# Patient Record
Sex: Female | Born: 1985
Health system: Southern US, Community
[De-identification: ages and names within clinical notes are randomized; demographics above are authoritative.]

## PROBLEM LIST (undated history)

## (undated) DIAGNOSIS — T7840XA Allergy, unspecified, initial encounter: Secondary | ICD-10-CM

## (undated) HISTORY — DX: Allergy, unspecified, initial encounter: T78.40XA

---
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
## 2007-01-18 ENCOUNTER — Inpatient Hospital Stay (HOSPITAL_COMMUNITY): Admission: AD | Admit: 2007-01-18 | Discharge: 2007-01-18 | Payer: Self-pay | Admitting: Obstetrics & Gynecology

## 2007-01-18 ENCOUNTER — Ambulatory Visit: Payer: Self-pay | Admitting: Obstetrics and Gynecology

## 2007-01-21 ENCOUNTER — Ambulatory Visit (HOSPITAL_COMMUNITY): Admission: RE | Admit: 2007-01-21 | Discharge: 2007-01-21 | Payer: Self-pay | Admitting: Obstetrics and Gynecology

## 2007-04-30 ENCOUNTER — Inpatient Hospital Stay (HOSPITAL_COMMUNITY): Admission: AD | Admit: 2007-04-30 | Discharge: 2007-05-03 | Payer: Self-pay | Admitting: Obstetrics & Gynecology

## 2007-06-20 ENCOUNTER — Emergency Department (HOSPITAL_COMMUNITY): Admission: EM | Admit: 2007-06-20 | Discharge: 2007-06-20 | Payer: Self-pay | Admitting: Emergency Medicine

## 2008-02-27 ENCOUNTER — Emergency Department (HOSPITAL_COMMUNITY): Admission: EM | Admit: 2008-02-27 | Discharge: 2008-02-28 | Payer: Self-pay | Admitting: Emergency Medicine

## 2010-06-09 LAB — URINALYSIS, ROUTINE W REFLEX MICROSCOPIC
Bilirubin Urine: NEGATIVE
Glucose, UA: NEGATIVE mg/dL
Leukocytes, UA: NEGATIVE
Protein, ur: 30 mg/dL — AB
Specific Gravity, Urine: 1.014 (ref 1.005–1.030)
Urobilinogen, UA: 1 mg/dL (ref 0.0–1.0)
pH: 8 (ref 5.0–8.0)

## 2010-06-09 LAB — RAPID STREP SCREEN (MED CTR MEBANE ONLY): Streptococcus, Group A Screen (Direct): NEGATIVE

## 2010-07-08 NOTE — H&P (Signed)
[name], [name] NO.:  [id number]   MEDICAL RECORD NO.:  [id number]          PATIENT TYPE:  INP   LOCATION:  9109                          FACILITY:  [facility]   PHYSICIAN:  Roseanna Rainbow, M.D.DATE OF BIRTH:  10-Feb-1986   DATE OF ADMISSION:  04/30/2007  DATE OF DISCHARGE:                              HISTORY & PHYSICAL   CHIEF COMPLAINT:  The patient is a 25 year old gravida 1, para 0 who  presents at 46 plus weeks complaining of  rupture of membranes.   HISTORY OF PRESENT ILLNESS:  Please see the above.  She complains of  mild uterine contractions.   MEDICATIONS:  Please see the medications reconciliation form.   PRENATAL LABORATORY DATA:  Hepatitis B surface antigen negative. Rubella  immune.  HIV nonreactive.  RPR nonreactive.  Group B Strep is negative.  One hour GGT 100.  Blood type is O positive, antibody screen negative.   OB risk factors limited prenatal care.   PAST MEDICAL HISTORY:  She denies.   PAST SURGICAL HISTORY:  She denies.   PAST GYN HISTORY:  Vulvar condyloma.   PHYSICAL EXAMINATION:  VITAL SIGNS:  Stable.  Afebrile.  Fetal heart  tracing reassuring.  PELVIC:  Per the R.N., grossly ruptured.  Cervix is 3 cm dilated.  Clear  fluid was noted.   ASSESSMENT:  Primigravida at term, prodromal labor.  Fetal heart tracing  consistent with fetal well-being.   PLAN:  Admission.  Expectant management for now.  Possible augmentation  of labor with low dose Pitocin per protocol.      Roseanna Rainbow, M.D.  Electronically Signed     LAJ/MEDQ  D:  05/01/2007  T:  05/01/2007  Job:  [id number]

## 2010-11-17 LAB — CBC
HCT: 25.5 — ABNORMAL LOW
Hemoglobin: 9 — ABNORMAL LOW
MCHC: 34.7
MCHC: 35.3
MCV: 90.7
MCV: 91
Platelets: 184
RBC: 3.65 — ABNORMAL LOW
RDW: 13.7
RDW: 13.8

## 2010-11-17 LAB — URINALYSIS, ROUTINE W REFLEX MICROSCOPIC
Bilirubin Urine: NEGATIVE
Ketones, ur: NEGATIVE
Leukocytes, UA: NEGATIVE
Nitrite: NEGATIVE
Protein, ur: NEGATIVE
Specific Gravity, Urine: <1.005 — ABNORMAL LOW

## 2010-11-17 LAB — URINE MICROSCOPIC-ADD ON

## 2010-12-02 LAB — URINALYSIS, ROUTINE W REFLEX MICROSCOPIC
Bilirubin Urine: NEGATIVE
Nitrite: NEGATIVE
Specific Gravity, Urine: 1.015
pH: 7

## 2013-06-20 ENCOUNTER — Ambulatory Visit (INDEPENDENT_AMBULATORY_CARE_PROVIDER_SITE_OTHER): Payer: 59 | Admitting: Obstetrics

## 2013-06-20 ENCOUNTER — Encounter: Payer: Self-pay | Admitting: Obstetrics

## 2013-06-20 ENCOUNTER — Other Ambulatory Visit: Payer: Self-pay | Admitting: *Deleted

## 2013-06-20 VITALS — BP 93/63 | HR 71 | Temp 98.5°F | Ht 65.5 in | Wt 132.0 lb

## 2013-06-20 DIAGNOSIS — Z3046 Encounter for surveillance of implantable subdermal contraceptive: Secondary | ICD-10-CM

## 2013-06-20 DIAGNOSIS — J302 Other seasonal allergic rhinitis: Secondary | ICD-10-CM

## 2013-06-20 DIAGNOSIS — J309 Allergic rhinitis, unspecified: Secondary | ICD-10-CM

## 2013-06-20 DIAGNOSIS — Z309 Encounter for contraceptive management, unspecified: Secondary | ICD-10-CM

## 2013-06-20 MED ORDER — MEDROXYPROGESTERONE ACETATE 150 MG/ML IM SUSP
150.0000 mg | Freq: Once | INTRAMUSCULAR | Status: AC
  Administered 2013-06-20: 150 mg via INTRAMUSCULAR

## 2013-06-20 MED ORDER — MEDROXYPROGESTERONE ACETATE 150 MG/ML IM SUSP: 150.0000 mg | INTRAMUSCULAR | Status: DC

## 2013-06-20 MED ORDER — DESLORATADINE 5 MG PO TABS: 5.0000 mg | ORAL_TABLET | Freq: Every day | ORAL | Status: DC

## 2013-06-21 ENCOUNTER — Encounter: Payer: Self-pay | Admitting: Obstetrics

## 2013-06-22 ENCOUNTER — Encounter: Payer: Self-pay | Admitting: Obstetrics

## 2013-06-22 NOTE — Progress Notes (Signed)

## 2013-08-08 ENCOUNTER — Telehealth: Payer: Self-pay | Admitting: *Deleted

## 2013-08-08 NOTE — Telephone Encounter (Signed)
Patient left message requesting call back. LM on VM to CB.

## 2013-08-09 NOTE — Telephone Encounter (Signed)
Dr Clearance CootsHarper spoke to patient on phone today- 2 sample packs of OCP given. Lo LO Estrin

## 2013-08-11 ENCOUNTER — Telehealth: Payer: Self-pay | Admitting: *Deleted

## 2013-08-11 NOTE — Telephone Encounter (Signed)
Patient contact the office and requested a call back. Unaware of reason for call. Attempted to contact the patient and left message for patient to call back.

## 2013-09-07 ENCOUNTER — Encounter: Payer: Self-pay | Admitting: Obstetrics

## 2013-09-07 ENCOUNTER — Ambulatory Visit: Payer: 59

## 2013-09-07 ENCOUNTER — Ambulatory Visit (INDEPENDENT_AMBULATORY_CARE_PROVIDER_SITE_OTHER): Payer: 59 | Admitting: Obstetrics

## 2013-09-07 VITALS — BP 120/72 | HR 90 | Temp 97.5°F | Ht 65.5 in | Wt 130.0 lb

## 2013-09-07 DIAGNOSIS — Z01419 Encounter for gynecological examination (general) (routine) without abnormal findings: Secondary | ICD-10-CM

## 2013-09-07 DIAGNOSIS — Z0000 Encounter for general adult medical examination without abnormal findings: Secondary | ICD-10-CM

## 2013-09-08 ENCOUNTER — Ambulatory Visit: Payer: 59

## 2013-09-08 LAB — PAP IG W/ RFLX HPV ASCU

## 2013-09-08 LAB — WET PREP BY MOLECULAR PROBE
Candida species: NEGATIVE
GARDNERELLA VAGINALIS: POSITIVE — AB
TRICHOMONAS VAG: NEGATIVE

## 2013-09-08 NOTE — Progress Notes (Signed)
Subjective:     Adrienne [name] is a 28 y.o. female here for a routine exam.  Current complaints: None.    Personal health questionnaire:  Is patient Ashkenazi Jewish, have a family history of breast and/or ovarian cancer: no Is there a family history of uterine cancer diagnosed at age < 78, gastrointestinal cancer, urinary tract cancer, family member who is a Personnel officer syndrome-associated carrier: no Is the patient overweight and hypertensive, family history of diabetes, personal history of gestational diabetes or PCOS: no Is patient over 37, have PCOS,  family history of premature CHD under age 29, diabetes, smoke, have hypertension or peripheral artery disease:  no  The HPI was reviewed and explored in further detail by the provider. Gynecologic History No LMP recorded. Patient has had an injection. Contraception: Depo-Provera injections Last Pap: 2014. Results were: normal Last mammogram: n/a. Results were: n/a  Obstetric History OB History  Gravida Para Term Preterm AB SAB TAB Ectopic Multiple Living  1 1 1  0 0 0 0 0 0 1    # Outcome Date GA Lbr Len/2nd Weight Sex Delivery Anes PTL Lv  1 TRM 05/01/07 [redacted]w[redacted]d  8 lb 9 oz (3.884 kg) M SVD None  Y      History reviewed. No pertinent past medical history.  History reviewed. No pertinent past surgical history.  Current outpatient prescriptions:desloratadine (CLARINEX) 5 MG tablet, Take 1 tablet (5 mg total) by mouth daily., Disp: 30 tablet, Rfl: 11;  medroxyPROGESTERone (DEPO-PROVERA) 150 MG/ML injection, Inject 1 mL (150 mg total) into the muscle every 3 (three) months., Disp: 1 mL, Rfl: 3 No Known Allergies  History  Substance Use Topics  . Smoking status: Former Smoker    Quit date: 09/08/2007  . Smokeless tobacco: Never Used  . Alcohol Use: Yes     Comment: occasionally     Family History  Problem Relation Age of Onset  . Hypertension Mother       Review of Systems  Constitutional: negative for fatigue and weight  loss Respiratory: negative for cough and wheezing Cardiovascular: negative for chest pain, fatigue and palpitations Gastrointestinal: negative for abdominal pain and change in bowel habits Musculoskeletal:negative for myalgias Neurological: negative for gait problems and tremors Behavioral/Psych: negative for abusive relationship, depression Endocrine: negative for temperature intolerance   Genitourinary:negative for abnormal menstrual periods, genital lesions, hot flashes, sexual problems and vaginal discharge Integument/breast: negative for breast lump, breast tenderness, nipple discharge and skin lesion(s)    Objective:       BP 120/72  Pulse 90  Temp(Src) 97.5 F (36.4 C)  Ht 5' 5.5" (1.664 m)  Wt 130 lb (58.968 kg)  BMI 21.30 kg/m2 General:   alert  Skin:   no rash or abnormalities  Lungs:   clear to auscultation bilaterally  Heart:   regular rate and rhythm, S1, S2 normal, no murmur, click, rub or gallop  Breasts:   normal without suspicious masses, skin or nipple changes or axillary nodes  Abdomen:  normal findings: no organomegaly, soft, non-tender and no hernia  Pelvis:  External genitalia: normal general appearance Urinary system: urethral meatus normal and bladder without fullness, nontender Vaginal: normal without tenderness, induration or masses Cervix: normal appearance Adnexa: normal bimanual exam Uterus: anteverted and non-tender, normal size   Lab Review Urine pregnancy test Labs reviewed yes Radiologic studies reviewed no    Assessment:    Healthy female exam.   Contraceptive surveillance.   Plan:    Education reviewed: safe sex/STD prevention. Contraception:  Depo-Provera injections. Follow up in: 1 year.   No orders of the defined types were placed in this encounter.   Orders Placed This Encounter  Procedures  . WET PREP BY MOLECULAR PROBE  . GC/Chlamydia Probe Amp

## 2013-09-09 LAB — GC/CHLAMYDIA PROBE AMP
CT Probe RNA: NEGATIVE
GC PROBE AMP APTIMA: NEGATIVE

## 2013-09-11 ENCOUNTER — Ambulatory Visit (INDEPENDENT_AMBULATORY_CARE_PROVIDER_SITE_OTHER): Payer: 59 | Admitting: *Deleted

## 2013-09-11 ENCOUNTER — Ambulatory Visit: Payer: 59

## 2013-09-11 VITALS — BP 108/75 | HR 78 | Temp 98.0°F | Ht 65.5 in | Wt 130.0 lb

## 2013-09-11 DIAGNOSIS — Z3042 Encounter for surveillance of injectable contraceptive: Secondary | ICD-10-CM

## 2013-09-11 DIAGNOSIS — Z3049 Encounter for surveillance of other contraceptives: Secondary | ICD-10-CM

## 2013-09-11 MED ORDER — MEDROXYPROGESTERONE ACETATE 150 MG/ML IM SUSP
150.0000 mg | INTRAMUSCULAR | Status: AC
  Administered 2013-09-11 – 2014-05-23 (×4): 150 mg via INTRAMUSCULAR

## 2013-09-11 NOTE — Progress Notes (Signed)
Patient is in the office today for her DEPO Injection. Patient is on time for her Injection. Patient states there are no concerns today. Injection given in Left Deltoid. Patient tolerated well. Patient advised to make an appointment with the front for December 03, 2013 for her next DEPO Injection. Patient voiced understanding.   BP 108/75  Pulse 78  Temp(Src) 98 F (36.7 C)  Ht 5' 5.5" (1.664 m)  Wt 130 lb (58.968 kg)  BMI 21.30 kg/m2  Administrations This Visit   medroxyPROGESTERone (DEPO-PROVERA) injection 150 mg   Administered Action Dose Route Administered By   09/11/2013 Given 150 mg Intramuscular Odessa FlemingKristina M Mahala Rommel, LPN

## 2013-09-25 ENCOUNTER — Other Ambulatory Visit: Payer: Self-pay | Admitting: *Deleted

## 2013-09-25 DIAGNOSIS — N760 Acute vaginitis: Secondary | ICD-10-CM

## 2013-09-25 DIAGNOSIS — B9689 Other specified bacterial agents as the cause of diseases classified elsewhere: Secondary | ICD-10-CM

## 2013-09-25 MED ORDER — METRONIDAZOLE 500 MG PO TABS: 500.0000 mg | ORAL_TABLET | Freq: Two times a day (BID) | ORAL | Status: DC

## 2013-12-07 ENCOUNTER — Ambulatory Visit (INDEPENDENT_AMBULATORY_CARE_PROVIDER_SITE_OTHER): Payer: 59 | Admitting: *Deleted

## 2013-12-07 VITALS — BP 116/75 | HR 60 | Temp 98.3°F | Wt 128.0 lb

## 2013-12-07 DIAGNOSIS — N939 Abnormal uterine and vaginal bleeding, unspecified: Secondary | ICD-10-CM

## 2013-12-07 DIAGNOSIS — Z3042 Encounter for surveillance of injectable contraceptive: Secondary | ICD-10-CM

## 2013-12-07 MED ORDER — LEVONORGESTREL-ETHINYL ESTRAD 0.15-30 MG-MCG PO TABS: 1.0000 | ORAL_TABLET | Freq: Every day | ORAL | Status: DC

## 2013-12-07 NOTE — Progress Notes (Signed)
Pt is in office today for Depo injection.  Pt is on time for injection.  Pt states that she had had some form of bleeding since injection in July.  Pt states that she was given samples of birth control to take for breakthrough bleeding.  Pt states that pills did help some but she continued to bleed.  In review with Dr Clearance CootsHarper, it is advised to give pt injection today, continue to watch bleeding.  If pt is still having bleeding in 3-4 weeks pt is to take Nordette that was sent to her pharmacy.  Pt advised to contact office if still bleeding after taking birth control or as needed. Depo was given, pt tolerated well.  BP 116/75  Pulse 60  Temp(Src) 98.3 F (36.8 C)  Wt 128 lb (58.06 kg)  Administrations This Visit   medroxyPROGESTERone (DEPO-PROVERA) injection 150 mg   Administered Action Dose Route Administered By   12/07/2013 Given 150 mg Intramuscular Lanney GinsSuzanne D Dalena Plantz, CMA

## 2014-02-21 ENCOUNTER — Other Ambulatory Visit: Payer: Self-pay | Admitting: Family

## 2014-02-21 MED ORDER — PREDNISONE 10 MG PO TABS: ORAL_TABLET | ORAL | Status: DC

## 2014-02-28 ENCOUNTER — Ambulatory Visit (INDEPENDENT_AMBULATORY_CARE_PROVIDER_SITE_OTHER): Payer: 59 | Admitting: *Deleted

## 2014-02-28 VITALS — BP 113/76 | HR 77 | Ht 65.5 in | Wt 130.0 lb

## 2014-02-28 DIAGNOSIS — Z3042 Encounter for surveillance of injectable contraceptive: Secondary | ICD-10-CM

## 2014-02-28 NOTE — Progress Notes (Signed)
Patient in the office for a Depo Injection. Patient is on time for her injection. Patient tolerated injection well.  Patient due for next injection on May 23, 2014.  BP 113/76 mmHg  Pulse 77  Ht 5' 5.5" (1.664 m)  Wt 130 lb (58.968 kg)  BMI 21.30 kg/m2   Administrations This Visit    medroxyPROGESTERone (DEPO-PROVERA) injection 150 mg    Administered Action Dose Route Administered By         02/28/2014 Given 150 mg Intramuscular Shelda PalAndrea Lynn Melvinia Ashby, LPN

## 2014-05-10 ENCOUNTER — Other Ambulatory Visit: Payer: Self-pay | Admitting: Obstetrics

## 2014-05-17 ENCOUNTER — Other Ambulatory Visit: Payer: Self-pay | Admitting: Obstetrics

## 2014-05-17 DIAGNOSIS — T7840XA Allergy, unspecified, initial encounter: Secondary | ICD-10-CM

## 2014-05-17 MED ORDER — PREDNISONE 10 MG PO KIT: 10.0000 mg | PACK | ORAL | Status: DC

## 2014-05-22 ENCOUNTER — Other Ambulatory Visit: Payer: Self-pay | Admitting: Obstetrics

## 2014-05-23 ENCOUNTER — Ambulatory Visit (INDEPENDENT_AMBULATORY_CARE_PROVIDER_SITE_OTHER): Payer: 59 | Admitting: *Deleted

## 2014-05-23 VITALS — BP 126/77 | HR 96 | Wt 133.0 lb

## 2014-05-23 DIAGNOSIS — Z3042 Encounter for surveillance of injectable contraceptive: Secondary | ICD-10-CM

## 2014-05-23 DIAGNOSIS — T7840XA Allergy, unspecified, initial encounter: Secondary | ICD-10-CM

## 2014-05-23 DIAGNOSIS — J302 Other seasonal allergic rhinitis: Secondary | ICD-10-CM

## 2014-05-23 DIAGNOSIS — L989 Disorder of the skin and subcutaneous tissue, unspecified: Secondary | ICD-10-CM

## 2014-05-23 MED ORDER — DESLORATADINE 5 MG PO TABS: 5.0000 mg | ORAL_TABLET | Freq: Every day | ORAL | Status: DC

## 2014-05-23 MED ORDER — PREDNISONE (PAK) 10 MG PO TABS: ORAL_TABLET | ORAL | Status: DC

## 2014-05-23 MED ORDER — PREDNISONE 10 MG PO KIT: 10.0000 mg | PACK | ORAL | Status: DC

## 2014-05-23 NOTE — Progress Notes (Signed)
Pt is in office today for depo injection.  Pt is on time for injection.  Pt tolerated well. Pt advised that she is due for annual exam in July and will need this appt before next depo can be given.  Pt advised to return to office on August 08, 2014 for next injection.   Pt also asked to speak with Dr Clearance CootsHarper regarding Prednisone refill and need for referral.  Pt was escorted to Dr Clearance CootsHarper office to discuss her skin condition and need for continuation of prednisone until she can be seen by dermatology and/or allergist.  Dermatology referral was ordered and pt was scheduled for appt.  BP 126/77 mmHg  Pulse 96  Wt 133 lb (60.328 kg)  Administrations This Visit    medroxyPROGESTERone (DEPO-PROVERA) injection 150 mg    Admin Date Action Dose Route Administered By         05/23/2014 Given 150 mg Intramuscular Lanney GinsSuzanne D Foster, CMA

## 2014-08-08 ENCOUNTER — Ambulatory Visit: Payer: 59

## 2014-09-24 ENCOUNTER — Telehealth: Payer: Self-pay | Admitting: *Deleted

## 2014-09-24 DIAGNOSIS — Z30013 Encounter for initial prescription of injectable contraceptive: Secondary | ICD-10-CM

## 2014-09-24 MED ORDER — MEDROXYPROGESTERONE ACETATE 150 MG/ML IM SUSP: INTRAMUSCULAR | Status: DC

## 2014-09-24 NOTE — Telephone Encounter (Signed)
Patient is overdue her annual and her Depo. Patient has not been active in over 1 year and she has been having regular cycles. Per Dr Luella Cook to schedule patient at 4:15 and restart her Depo.

## 2014-10-09 ENCOUNTER — Ambulatory Visit: Payer: 59 | Admitting: Obstetrics

## 2014-10-18 ENCOUNTER — Ambulatory Visit: Payer: 59 | Admitting: Obstetrics

## 2014-10-25 ENCOUNTER — Ambulatory Visit: Payer: 59 | Admitting: Obstetrics

## 2016-02-26 DIAGNOSIS — J3089 Other allergic rhinitis: Secondary | ICD-10-CM | POA: Diagnosis not present

## 2016-02-26 DIAGNOSIS — J3081 Allergic rhinitis due to animal (cat) (dog) hair and dander: Secondary | ICD-10-CM | POA: Diagnosis not present

## 2016-02-26 DIAGNOSIS — J301 Allergic rhinitis due to pollen: Secondary | ICD-10-CM | POA: Diagnosis not present

## 2016-03-05 DIAGNOSIS — J301 Allergic rhinitis due to pollen: Secondary | ICD-10-CM | POA: Diagnosis not present

## 2016-03-05 DIAGNOSIS — J3089 Other allergic rhinitis: Secondary | ICD-10-CM | POA: Diagnosis not present

## 2016-03-05 DIAGNOSIS — J3081 Allergic rhinitis due to animal (cat) (dog) hair and dander: Secondary | ICD-10-CM | POA: Diagnosis not present

## 2016-03-18 DIAGNOSIS — J301 Allergic rhinitis due to pollen: Secondary | ICD-10-CM | POA: Diagnosis not present

## 2016-03-18 DIAGNOSIS — J3089 Other allergic rhinitis: Secondary | ICD-10-CM | POA: Diagnosis not present

## 2016-03-18 DIAGNOSIS — J3081 Allergic rhinitis due to animal (cat) (dog) hair and dander: Secondary | ICD-10-CM | POA: Diagnosis not present

## 2016-03-27 DIAGNOSIS — J3081 Allergic rhinitis due to animal (cat) (dog) hair and dander: Secondary | ICD-10-CM | POA: Diagnosis not present

## 2016-03-27 DIAGNOSIS — J3089 Other allergic rhinitis: Secondary | ICD-10-CM | POA: Diagnosis not present

## 2016-03-27 DIAGNOSIS — J301 Allergic rhinitis due to pollen: Secondary | ICD-10-CM | POA: Diagnosis not present

## 2016-04-02 DIAGNOSIS — J3081 Allergic rhinitis due to animal (cat) (dog) hair and dander: Secondary | ICD-10-CM | POA: Diagnosis not present

## 2016-04-02 DIAGNOSIS — J301 Allergic rhinitis due to pollen: Secondary | ICD-10-CM | POA: Diagnosis not present

## 2016-04-02 DIAGNOSIS — J3089 Other allergic rhinitis: Secondary | ICD-10-CM | POA: Diagnosis not present

## 2016-04-08 DIAGNOSIS — J3081 Allergic rhinitis due to animal (cat) (dog) hair and dander: Secondary | ICD-10-CM | POA: Diagnosis not present

## 2016-04-08 DIAGNOSIS — J3089 Other allergic rhinitis: Secondary | ICD-10-CM | POA: Diagnosis not present

## 2016-04-08 DIAGNOSIS — J301 Allergic rhinitis due to pollen: Secondary | ICD-10-CM | POA: Diagnosis not present

## 2016-04-15 DIAGNOSIS — J3081 Allergic rhinitis due to animal (cat) (dog) hair and dander: Secondary | ICD-10-CM | POA: Diagnosis not present

## 2016-04-15 DIAGNOSIS — J3089 Other allergic rhinitis: Secondary | ICD-10-CM | POA: Diagnosis not present

## 2016-04-15 DIAGNOSIS — J301 Allergic rhinitis due to pollen: Secondary | ICD-10-CM | POA: Diagnosis not present

## 2016-04-22 DIAGNOSIS — J3089 Other allergic rhinitis: Secondary | ICD-10-CM | POA: Diagnosis not present

## 2016-04-22 DIAGNOSIS — J301 Allergic rhinitis due to pollen: Secondary | ICD-10-CM | POA: Diagnosis not present

## 2016-04-22 DIAGNOSIS — J3081 Allergic rhinitis due to animal (cat) (dog) hair and dander: Secondary | ICD-10-CM | POA: Diagnosis not present

## 2016-04-29 DIAGNOSIS — J3089 Other allergic rhinitis: Secondary | ICD-10-CM | POA: Diagnosis not present

## 2016-04-29 DIAGNOSIS — J3081 Allergic rhinitis due to animal (cat) (dog) hair and dander: Secondary | ICD-10-CM | POA: Diagnosis not present

## 2016-04-29 DIAGNOSIS — J301 Allergic rhinitis due to pollen: Secondary | ICD-10-CM | POA: Diagnosis not present

## 2016-05-06 DIAGNOSIS — J3081 Allergic rhinitis due to animal (cat) (dog) hair and dander: Secondary | ICD-10-CM | POA: Diagnosis not present

## 2016-05-06 DIAGNOSIS — J301 Allergic rhinitis due to pollen: Secondary | ICD-10-CM | POA: Diagnosis not present

## 2016-05-06 DIAGNOSIS — J3089 Other allergic rhinitis: Secondary | ICD-10-CM | POA: Diagnosis not present

## 2016-05-13 DIAGNOSIS — J3081 Allergic rhinitis due to animal (cat) (dog) hair and dander: Secondary | ICD-10-CM | POA: Diagnosis not present

## 2016-05-13 DIAGNOSIS — J301 Allergic rhinitis due to pollen: Secondary | ICD-10-CM | POA: Diagnosis not present

## 2016-05-13 DIAGNOSIS — J3089 Other allergic rhinitis: Secondary | ICD-10-CM | POA: Diagnosis not present

## 2016-05-20 DIAGNOSIS — J3081 Allergic rhinitis due to animal (cat) (dog) hair and dander: Secondary | ICD-10-CM | POA: Diagnosis not present

## 2016-05-20 DIAGNOSIS — J301 Allergic rhinitis due to pollen: Secondary | ICD-10-CM | POA: Diagnosis not present

## 2016-05-20 DIAGNOSIS — J3089 Other allergic rhinitis: Secondary | ICD-10-CM | POA: Diagnosis not present

## 2016-05-27 DIAGNOSIS — J301 Allergic rhinitis due to pollen: Secondary | ICD-10-CM | POA: Diagnosis not present

## 2016-05-27 DIAGNOSIS — J3089 Other allergic rhinitis: Secondary | ICD-10-CM | POA: Diagnosis not present

## 2016-05-27 DIAGNOSIS — J3081 Allergic rhinitis due to animal (cat) (dog) hair and dander: Secondary | ICD-10-CM | POA: Diagnosis not present

## 2016-06-03 DIAGNOSIS — J3089 Other allergic rhinitis: Secondary | ICD-10-CM | POA: Diagnosis not present

## 2016-06-03 DIAGNOSIS — J301 Allergic rhinitis due to pollen: Secondary | ICD-10-CM | POA: Diagnosis not present

## 2016-06-03 DIAGNOSIS — J3081 Allergic rhinitis due to animal (cat) (dog) hair and dander: Secondary | ICD-10-CM | POA: Diagnosis not present

## 2016-06-10 DIAGNOSIS — J301 Allergic rhinitis due to pollen: Secondary | ICD-10-CM | POA: Diagnosis not present

## 2016-06-10 DIAGNOSIS — J3089 Other allergic rhinitis: Secondary | ICD-10-CM | POA: Diagnosis not present

## 2016-06-10 DIAGNOSIS — J3081 Allergic rhinitis due to animal (cat) (dog) hair and dander: Secondary | ICD-10-CM | POA: Diagnosis not present

## 2016-06-17 DIAGNOSIS — J301 Allergic rhinitis due to pollen: Secondary | ICD-10-CM | POA: Diagnosis not present

## 2016-06-17 DIAGNOSIS — J3089 Other allergic rhinitis: Secondary | ICD-10-CM | POA: Diagnosis not present

## 2016-06-17 DIAGNOSIS — J3081 Allergic rhinitis due to animal (cat) (dog) hair and dander: Secondary | ICD-10-CM | POA: Diagnosis not present

## 2016-06-24 DIAGNOSIS — J3089 Other allergic rhinitis: Secondary | ICD-10-CM | POA: Diagnosis not present

## 2016-06-24 DIAGNOSIS — J3081 Allergic rhinitis due to animal (cat) (dog) hair and dander: Secondary | ICD-10-CM | POA: Diagnosis not present

## 2016-06-24 DIAGNOSIS — J301 Allergic rhinitis due to pollen: Secondary | ICD-10-CM | POA: Diagnosis not present

## 2016-07-01 DIAGNOSIS — J3081 Allergic rhinitis due to animal (cat) (dog) hair and dander: Secondary | ICD-10-CM | POA: Diagnosis not present

## 2016-07-01 DIAGNOSIS — J301 Allergic rhinitis due to pollen: Secondary | ICD-10-CM | POA: Diagnosis not present

## 2016-07-01 DIAGNOSIS — J3089 Other allergic rhinitis: Secondary | ICD-10-CM | POA: Diagnosis not present

## 2016-07-08 DIAGNOSIS — J3089 Other allergic rhinitis: Secondary | ICD-10-CM | POA: Diagnosis not present

## 2016-07-08 DIAGNOSIS — J301 Allergic rhinitis due to pollen: Secondary | ICD-10-CM | POA: Diagnosis not present

## 2016-07-08 DIAGNOSIS — J3081 Allergic rhinitis due to animal (cat) (dog) hair and dander: Secondary | ICD-10-CM | POA: Diagnosis not present

## 2016-07-15 DIAGNOSIS — J301 Allergic rhinitis due to pollen: Secondary | ICD-10-CM | POA: Diagnosis not present

## 2016-07-15 DIAGNOSIS — J3089 Other allergic rhinitis: Secondary | ICD-10-CM | POA: Diagnosis not present

## 2016-07-15 DIAGNOSIS — J3081 Allergic rhinitis due to animal (cat) (dog) hair and dander: Secondary | ICD-10-CM | POA: Diagnosis not present

## 2016-07-27 ENCOUNTER — Ambulatory Visit (INDEPENDENT_AMBULATORY_CARE_PROVIDER_SITE_OTHER): Payer: 59 | Admitting: Family Medicine

## 2016-07-27 ENCOUNTER — Encounter: Payer: Self-pay | Admitting: Family Medicine

## 2016-07-27 VITALS — BP 100/70 | HR 88 | Temp 99.3°F | Resp 12 | Ht 65.5 in | Wt 120.0 lb

## 2016-07-27 DIAGNOSIS — J029 Acute pharyngitis, unspecified: Secondary | ICD-10-CM | POA: Diagnosis not present

## 2016-07-27 DIAGNOSIS — J039 Acute tonsillitis, unspecified: Secondary | ICD-10-CM | POA: Diagnosis not present

## 2016-07-27 DIAGNOSIS — I889 Nonspecific lymphadenitis, unspecified: Secondary | ICD-10-CM

## 2016-07-27 LAB — POCT RAPID STREP A (OFFICE): RAPID STREP A SCREEN: NEGATIVE

## 2016-07-27 MED ORDER — KETOROLAC TROMETHAMINE 60 MG/2ML IM SOLN
60.0000 mg | Freq: Once | INTRAMUSCULAR | Status: AC
  Administered 2016-07-27: 60 mg via INTRAMUSCULAR

## 2016-07-27 MED ORDER — TRAMADOL HCL 50 MG PO TABS: 50.0000 mg | ORAL_TABLET | Freq: Three times a day (TID) | ORAL | 0 refills | Status: AC | PRN

## 2016-07-27 MED ORDER — AMOXICILLIN-POT CLAVULANATE 875-125 MG PO TABS: 1.0000 | ORAL_TABLET | Freq: Two times a day (BID) | ORAL | 0 refills | Status: AC

## 2016-07-27 MED ORDER — MAGIC MOUTHWASH W/LIDOCAINE: 5.0000 mL | Freq: Three times a day (TID) | ORAL | 0 refills | Status: AC | PRN

## 2016-07-27 MED ORDER — IBUPROFEN 600 MG PO TABS: 600.0000 mg | ORAL_TABLET | Freq: Three times a day (TID) | ORAL | 0 refills | Status: AC | PRN

## 2016-07-27 NOTE — Progress Notes (Signed)
HPI:   [name] is a 31 y.o. female, who is here today to establish care.  Former PCP: N/A Last preventive routine visit: She follows with her gyn regularly, 11/2015.  Chronic medical problems: seasonal allergies, she follows with immunologists and on immunotherapy, weekly allergy shots.   Concerns today: Sore throat "very painful."  Severe sore throat that started yesterday afternoon suddenly. She has had some dry throat sensation fir the past 2-3 days, yesterday she woke up with sore throat. Pain is exacerbated by swallowing,talking,and mouth opening. No alleviating factors.  She has some post nasal drainage that she tries to bring up, this also exacerbate pain.  Noted right swollen gland,tender. + Right earache. + Chills. She has not noted fever, dysphagia, stridor, cough, wheezing, chest pain, dyspnea, abdominal pain, nausea, vomiting, or skin rash.  She denies any sick contact or recent travel. She has taken Ibuprofen, last dose yesterday.   Review of Systems  Constitutional: Positive for activity change, appetite change, chills and fatigue. Negative for fever.  HENT: Positive for ear pain, postnasal drip and sore throat. Negative for congestion, drooling, facial swelling, mouth sores, rhinorrhea, sinus pressure, trouble swallowing and voice change.   Eyes: Negative for discharge and redness.  Respiratory: Negative for cough, shortness of breath, wheezing and stridor.   Cardiovascular: Negative for palpitations and leg swelling.  Gastrointestinal: Negative for abdominal pain, diarrhea, nausea and vomiting.  Musculoskeletal: Positive for neck pain. Negative for myalgias and neck stiffness.  Skin: Negative for rash.  Allergic/Immunologic: Positive for environmental allergies.  Neurological: Negative for syncope, weakness and headaches.  Hematological: Positive for adenopathy. Does not bruise/bleed easily.  Psychiatric/Behavioral: Negative for confusion.  The patient is nervous/anxious.      Current Outpatient Prescriptions on File Prior to Visit  Medication Sig Dispense Refill  . desloratadine (CLARINEX) 5 MG tablet Take 1 tablet (5 mg total) by mouth daily. 30 tablet 11  . medroxyPROGESTERone (DEPO-PROVERA) 150 MG/ML injection INJECT 1 ML INTO THE MUSCLE EVERY 3 MONTHS 1 mL 3   No current facility-administered medications on file prior to visit.      Past Medical History:  Diagnosis Date  . Allergy    No Known Allergies  Family History  Problem Relation Age of Onset  . Hypertension Mother     Social History   Social History  . Marital status: Single    Spouse name: N/A  . Number of children: N/A  . Years of education: N/A   Social History Main Topics  . Smoking status: Former Smoker    Quit date: 09/08/2007  . Smokeless tobacco: Never Used  . Alcohol use 0.0 oz/week     Comment: occasionally   . Drug use: No  . Sexual activity: Not Currently    Partners: Male    Birth control/ protection: Injection   Other Topics Concern  . None   Social History Narrative  . None    Vitals:   07/27/16 1350  BP: 100/70  Pulse: 88  Resp: 12  Temp: 99.3 F (37.4 C)   O2 sat at RA 95% Body mass index is 19.67 kg/m.   Physical Exam  Nursing note and vitals reviewed. Constitutional: She is oriented to person, place, and time. She appears well-developed and well-nourished. She does not appear ill. No distress.  HENT:  Head: Atraumatic.  Right Ear: Tympanic membrane, external ear and ear canal normal.  Left Ear: Tympanic membrane, external ear and ear canal normal.  Nose:  Right sinus exhibits no maxillary sinus tenderness and no frontal sinus tenderness. Left sinus exhibits no maxillary sinus tenderness and no frontal sinus tenderness.  Mouth/Throat: Mucous membranes are normal. No oral lesions. There is trismus (mild) in the jaw. Uvula swelling present. Posterior oropharyngeal erythema present. No oropharyngeal exudate.  Tonsillar abscesses: ? right-sided.  Mild edema or uvula, mildly deviated to left. No petechial rash appreciated. Hypertrophic and erythematous tonsils, R>L. No drooling or dysphonia appreciated.   Eyes: Conjunctivae and EOM are normal.  Neck: No muscular tenderness present. No tracheal deviation, no edema and no erythema present.  Cardiovascular: Normal rate and regular rhythm.   No murmur heard. Respiratory: Effort normal and breath sounds normal. No stridor. No respiratory distress.  Musculoskeletal: She exhibits no edema.       Cervical back: She exhibits normal range of motion, no tenderness and no bony tenderness.  Lymphadenopathy:       Head (right side): Submandibular adenopathy present.       Head (left side): No submandibular adenopathy present.    She has cervical adenopathy.       Right cervical: Superficial cervical adenopathy present. No posterior cervical adenopathy present.      Left cervical: No posterior cervical adenopathy present.  Neurological: She is alert and oriented to person, place, and time. She has normal strength. No cranial nerve deficit. Coordination and gait normal.  Skin: Skin is warm. No rash noted. No erythema.  Psychiatric: Her mood appears anxious.  Well groomed, good eye contact.    ASSESSMENT AND PLAN:   Adrienne [name] was seen today for sore throat.  Diagnoses and all orders for this visit:  Sore throat -     POC Rapid Strep A -     Ambulatory referral to ENT  Acute tonsillitis, unspecified etiology  Question of peri tonsillar abscess right-sided. No stridor or signs of airway obstruction at the time of examination, so I do not think she needs ER evaluation at this time. Oral Abx started,some side effects discussed. Rapid strep negative, we will follow throat Cx. She has appt with Adrienne [name] tomorrow. She was clearly instructed about warning signs, she voices understanding.  -     amoxicillin-clavulanate (AUGMENTIN) 875-125 MG tablet; Take 1  tablet by mouth 2 (two) times daily. -     ibuprofen (ADVIL,MOTRIN) 600 MG tablet; Take 1 tablet (600 mg total) by mouth every 8 (eight) hours as needed. -     traMADol (ULTRAM) 50 MG tablet; Take 1 tablet (50 mg total) by mouth every 8 (eight) hours as needed. -     magic mouthwash w/lidocaine SOLN; Take 5 mLs by mouth 3 (three) times daily as needed for mouth pain.  Cervical lymphadenitis  Abx started. Skin is intact. Side effects of medications discussed, she can take Toradol at bedtime.   -     amoxicillin-clavulanate (AUGMENTIN) 875-125 MG tablet; Take 1 tablet by mouth 2 (two) times daily. -     ibuprofen (ADVIL,MOTRIN) 600 MG tablet; Take 1 tablet (600 mg total) by mouth every 8 (eight) hours as needed.      Khadeem Rockett G. [place], MD  [facility]. [place] office.

## 2016-07-27 NOTE — Patient Instructions (Signed)
A few things to remember from today's visit:   Sore throat - Plan: POC Rapid Strep A, Ambulatory referral to ENT  Acute tonsillitis, unspecified etiology - Plan: amoxicillin-clavulanate (AUGMENTIN) 875-125 MG tablet, ibuprofen (ADVIL,MOTRIN) 600 MG tablet, traMADol (ULTRAM) 50 MG tablet, magic mouthwash w/lidocaine SOLN  Cervical lymphadenitis - Plan: amoxicillin-clavulanate (AUGMENTIN) 875-125 MG tablet, ibuprofen (ADVIL,MOTRIN) 600 MG tablet  ENT appt tomorrow. Ibuprofen can be repeated in 6-8 hours.   Please be sure medication list is accurate. If a new problem present, please set up appointment sooner than planned today.

## 2016-07-28 DIAGNOSIS — J039 Acute tonsillitis, unspecified: Secondary | ICD-10-CM | POA: Diagnosis not present

## 2016-07-28 DIAGNOSIS — J36 Peritonsillar abscess: Secondary | ICD-10-CM | POA: Diagnosis not present

## 2016-07-29 DIAGNOSIS — J3089 Other allergic rhinitis: Secondary | ICD-10-CM | POA: Diagnosis not present

## 2016-07-29 DIAGNOSIS — J3081 Allergic rhinitis due to animal (cat) (dog) hair and dander: Secondary | ICD-10-CM | POA: Diagnosis not present

## 2016-07-29 DIAGNOSIS — J301 Allergic rhinitis due to pollen: Secondary | ICD-10-CM | POA: Diagnosis not present

## 2016-07-29 LAB — CULTURE, GROUP A STREP

## 2016-08-05 DIAGNOSIS — J3081 Allergic rhinitis due to animal (cat) (dog) hair and dander: Secondary | ICD-10-CM | POA: Diagnosis not present

## 2016-08-05 DIAGNOSIS — J3089 Other allergic rhinitis: Secondary | ICD-10-CM | POA: Diagnosis not present

## 2016-08-05 DIAGNOSIS — J301 Allergic rhinitis due to pollen: Secondary | ICD-10-CM | POA: Diagnosis not present

## 2016-08-06 DIAGNOSIS — J301 Allergic rhinitis due to pollen: Secondary | ICD-10-CM | POA: Diagnosis not present

## 2016-08-07 DIAGNOSIS — J3081 Allergic rhinitis due to animal (cat) (dog) hair and dander: Secondary | ICD-10-CM | POA: Diagnosis not present

## 2016-08-07 DIAGNOSIS — J3089 Other allergic rhinitis: Secondary | ICD-10-CM | POA: Diagnosis not present

## 2016-08-19 DIAGNOSIS — J3081 Allergic rhinitis due to animal (cat) (dog) hair and dander: Secondary | ICD-10-CM | POA: Diagnosis not present

## 2016-08-19 DIAGNOSIS — J3089 Other allergic rhinitis: Secondary | ICD-10-CM | POA: Diagnosis not present

## 2016-08-19 DIAGNOSIS — J301 Allergic rhinitis due to pollen: Secondary | ICD-10-CM | POA: Diagnosis not present

## 2016-09-08 DIAGNOSIS — J3081 Allergic rhinitis due to animal (cat) (dog) hair and dander: Secondary | ICD-10-CM | POA: Diagnosis not present

## 2016-09-08 DIAGNOSIS — J301 Allergic rhinitis due to pollen: Secondary | ICD-10-CM | POA: Diagnosis not present

## 2016-09-08 DIAGNOSIS — J3089 Other allergic rhinitis: Secondary | ICD-10-CM | POA: Diagnosis not present

## 2016-09-30 DIAGNOSIS — J3081 Allergic rhinitis due to animal (cat) (dog) hair and dander: Secondary | ICD-10-CM | POA: Diagnosis not present

## 2016-09-30 DIAGNOSIS — J301 Allergic rhinitis due to pollen: Secondary | ICD-10-CM | POA: Diagnosis not present

## 2016-09-30 DIAGNOSIS — J3089 Other allergic rhinitis: Secondary | ICD-10-CM | POA: Diagnosis not present

## 2016-10-21 DIAGNOSIS — J3089 Other allergic rhinitis: Secondary | ICD-10-CM | POA: Diagnosis not present

## 2016-10-21 DIAGNOSIS — J301 Allergic rhinitis due to pollen: Secondary | ICD-10-CM | POA: Diagnosis not present

## 2016-10-21 DIAGNOSIS — J3081 Allergic rhinitis due to animal (cat) (dog) hair and dander: Secondary | ICD-10-CM | POA: Diagnosis not present

## 2016-10-23 DIAGNOSIS — J301 Allergic rhinitis due to pollen: Secondary | ICD-10-CM | POA: Diagnosis not present

## 2016-10-23 DIAGNOSIS — J3081 Allergic rhinitis due to animal (cat) (dog) hair and dander: Secondary | ICD-10-CM | POA: Diagnosis not present

## 2016-10-23 DIAGNOSIS — J3089 Other allergic rhinitis: Secondary | ICD-10-CM | POA: Diagnosis not present

## 2016-10-30 DIAGNOSIS — J301 Allergic rhinitis due to pollen: Secondary | ICD-10-CM | POA: Diagnosis not present

## 2016-10-30 DIAGNOSIS — J3089 Other allergic rhinitis: Secondary | ICD-10-CM | POA: Diagnosis not present

## 2016-10-30 DIAGNOSIS — J3081 Allergic rhinitis due to animal (cat) (dog) hair and dander: Secondary | ICD-10-CM | POA: Diagnosis not present

## 2016-11-04 DIAGNOSIS — J3089 Other allergic rhinitis: Secondary | ICD-10-CM | POA: Diagnosis not present

## 2016-11-04 DIAGNOSIS — J3081 Allergic rhinitis due to animal (cat) (dog) hair and dander: Secondary | ICD-10-CM | POA: Diagnosis not present

## 2016-11-04 DIAGNOSIS — J301 Allergic rhinitis due to pollen: Secondary | ICD-10-CM | POA: Diagnosis not present

## 2016-11-11 DIAGNOSIS — J3081 Allergic rhinitis due to animal (cat) (dog) hair and dander: Secondary | ICD-10-CM | POA: Diagnosis not present

## 2016-11-11 DIAGNOSIS — J3089 Other allergic rhinitis: Secondary | ICD-10-CM | POA: Diagnosis not present

## 2016-11-11 DIAGNOSIS — J301 Allergic rhinitis due to pollen: Secondary | ICD-10-CM | POA: Diagnosis not present

## 2016-11-18 DIAGNOSIS — J301 Allergic rhinitis due to pollen: Secondary | ICD-10-CM | POA: Diagnosis not present

## 2016-11-18 DIAGNOSIS — J3081 Allergic rhinitis due to animal (cat) (dog) hair and dander: Secondary | ICD-10-CM | POA: Diagnosis not present

## 2016-11-18 DIAGNOSIS — J3089 Other allergic rhinitis: Secondary | ICD-10-CM | POA: Diagnosis not present

## 2016-11-25 DIAGNOSIS — J3089 Other allergic rhinitis: Secondary | ICD-10-CM | POA: Diagnosis not present

## 2016-11-25 DIAGNOSIS — J301 Allergic rhinitis due to pollen: Secondary | ICD-10-CM | POA: Diagnosis not present

## 2016-11-25 DIAGNOSIS — J3081 Allergic rhinitis due to animal (cat) (dog) hair and dander: Secondary | ICD-10-CM | POA: Diagnosis not present

## 2016-12-02 DIAGNOSIS — J301 Allergic rhinitis due to pollen: Secondary | ICD-10-CM | POA: Diagnosis not present

## 2016-12-02 DIAGNOSIS — J3081 Allergic rhinitis due to animal (cat) (dog) hair and dander: Secondary | ICD-10-CM | POA: Diagnosis not present

## 2016-12-02 DIAGNOSIS — J3089 Other allergic rhinitis: Secondary | ICD-10-CM | POA: Diagnosis not present

## 2016-12-22 DIAGNOSIS — J301 Allergic rhinitis due to pollen: Secondary | ICD-10-CM | POA: Diagnosis not present

## 2016-12-22 DIAGNOSIS — J3081 Allergic rhinitis due to animal (cat) (dog) hair and dander: Secondary | ICD-10-CM | POA: Diagnosis not present

## 2016-12-22 DIAGNOSIS — J3089 Other allergic rhinitis: Secondary | ICD-10-CM | POA: Diagnosis not present

## 2017-01-12 DIAGNOSIS — J3089 Other allergic rhinitis: Secondary | ICD-10-CM | POA: Diagnosis not present

## 2017-01-12 DIAGNOSIS — J3081 Allergic rhinitis due to animal (cat) (dog) hair and dander: Secondary | ICD-10-CM | POA: Diagnosis not present

## 2017-01-12 DIAGNOSIS — J301 Allergic rhinitis due to pollen: Secondary | ICD-10-CM | POA: Diagnosis not present

## 2017-02-03 DIAGNOSIS — J3081 Allergic rhinitis due to animal (cat) (dog) hair and dander: Secondary | ICD-10-CM | POA: Diagnosis not present

## 2017-02-03 DIAGNOSIS — J301 Allergic rhinitis due to pollen: Secondary | ICD-10-CM | POA: Diagnosis not present

## 2017-02-03 DIAGNOSIS — J3089 Other allergic rhinitis: Secondary | ICD-10-CM | POA: Diagnosis not present

## 2017-02-10 DIAGNOSIS — J3081 Allergic rhinitis due to animal (cat) (dog) hair and dander: Secondary | ICD-10-CM | POA: Diagnosis not present

## 2017-02-10 DIAGNOSIS — J3089 Other allergic rhinitis: Secondary | ICD-10-CM | POA: Diagnosis not present

## 2017-02-10 DIAGNOSIS — J301 Allergic rhinitis due to pollen: Secondary | ICD-10-CM | POA: Diagnosis not present

## 2017-02-24 DIAGNOSIS — J3081 Allergic rhinitis due to animal (cat) (dog) hair and dander: Secondary | ICD-10-CM | POA: Diagnosis not present

## 2017-02-24 DIAGNOSIS — J301 Allergic rhinitis due to pollen: Secondary | ICD-10-CM | POA: Diagnosis not present

## 2017-02-24 DIAGNOSIS — J3089 Other allergic rhinitis: Secondary | ICD-10-CM | POA: Diagnosis not present

## 2017-02-26 DIAGNOSIS — L509 Urticaria, unspecified: Secondary | ICD-10-CM | POA: Diagnosis not present

## 2017-02-26 DIAGNOSIS — J301 Allergic rhinitis due to pollen: Secondary | ICD-10-CM | POA: Diagnosis not present

## 2017-02-26 DIAGNOSIS — J3089 Other allergic rhinitis: Secondary | ICD-10-CM | POA: Diagnosis not present

## 2017-03-12 DIAGNOSIS — J3081 Allergic rhinitis due to animal (cat) (dog) hair and dander: Secondary | ICD-10-CM | POA: Diagnosis not present

## 2017-03-12 DIAGNOSIS — J3089 Other allergic rhinitis: Secondary | ICD-10-CM | POA: Diagnosis not present

## 2017-03-12 DIAGNOSIS — J301 Allergic rhinitis due to pollen: Secondary | ICD-10-CM | POA: Diagnosis not present

## 2017-03-31 DIAGNOSIS — J3081 Allergic rhinitis due to animal (cat) (dog) hair and dander: Secondary | ICD-10-CM | POA: Diagnosis not present

## 2017-03-31 DIAGNOSIS — J301 Allergic rhinitis due to pollen: Secondary | ICD-10-CM | POA: Diagnosis not present

## 2017-03-31 DIAGNOSIS — J3089 Other allergic rhinitis: Secondary | ICD-10-CM | POA: Diagnosis not present

## 2017-05-05 DIAGNOSIS — J3081 Allergic rhinitis due to animal (cat) (dog) hair and dander: Secondary | ICD-10-CM | POA: Diagnosis not present

## 2017-05-05 DIAGNOSIS — J3089 Other allergic rhinitis: Secondary | ICD-10-CM | POA: Diagnosis not present

## 2017-05-05 DIAGNOSIS — J301 Allergic rhinitis due to pollen: Secondary | ICD-10-CM | POA: Diagnosis not present

## 2017-06-04 DIAGNOSIS — J3081 Allergic rhinitis due to animal (cat) (dog) hair and dander: Secondary | ICD-10-CM | POA: Diagnosis not present

## 2017-06-04 DIAGNOSIS — J3089 Other allergic rhinitis: Secondary | ICD-10-CM | POA: Diagnosis not present

## 2017-06-04 DIAGNOSIS — J301 Allergic rhinitis due to pollen: Secondary | ICD-10-CM | POA: Diagnosis not present

## 2017-06-08 DIAGNOSIS — J301 Allergic rhinitis due to pollen: Secondary | ICD-10-CM | POA: Diagnosis not present

## 2017-06-08 DIAGNOSIS — J3081 Allergic rhinitis due to animal (cat) (dog) hair and dander: Secondary | ICD-10-CM | POA: Diagnosis not present

## 2017-06-08 DIAGNOSIS — J3089 Other allergic rhinitis: Secondary | ICD-10-CM | POA: Diagnosis not present

## 2017-06-16 DIAGNOSIS — J3081 Allergic rhinitis due to animal (cat) (dog) hair and dander: Secondary | ICD-10-CM | POA: Diagnosis not present

## 2017-06-16 DIAGNOSIS — J301 Allergic rhinitis due to pollen: Secondary | ICD-10-CM | POA: Diagnosis not present

## 2017-06-16 DIAGNOSIS — J3089 Other allergic rhinitis: Secondary | ICD-10-CM | POA: Diagnosis not present

## 2017-06-23 DIAGNOSIS — J3081 Allergic rhinitis due to animal (cat) (dog) hair and dander: Secondary | ICD-10-CM | POA: Diagnosis not present

## 2017-06-23 DIAGNOSIS — J3089 Other allergic rhinitis: Secondary | ICD-10-CM | POA: Diagnosis not present

## 2017-06-23 DIAGNOSIS — J301 Allergic rhinitis due to pollen: Secondary | ICD-10-CM | POA: Diagnosis not present

## 2017-06-29 DIAGNOSIS — J301 Allergic rhinitis due to pollen: Secondary | ICD-10-CM | POA: Diagnosis not present

## 2017-06-30 DIAGNOSIS — J3081 Allergic rhinitis due to animal (cat) (dog) hair and dander: Secondary | ICD-10-CM | POA: Diagnosis not present

## 2017-06-30 DIAGNOSIS — J301 Allergic rhinitis due to pollen: Secondary | ICD-10-CM | POA: Diagnosis not present

## 2017-06-30 DIAGNOSIS — J3089 Other allergic rhinitis: Secondary | ICD-10-CM | POA: Diagnosis not present

## 2017-07-09 ENCOUNTER — Encounter: Payer: Self-pay | Admitting: Obstetrics

## 2017-07-09 ENCOUNTER — Other Ambulatory Visit (HOSPITAL_COMMUNITY)
Admission: RE | Admit: 2017-07-09 | Discharge: 2017-07-09 | Disposition: A | Discharge status: Home / Self Care | Payer: 59 | Source: Ambulatory Visit | Attending: Obstetrics | Admitting: Obstetrics

## 2017-07-09 ENCOUNTER — Other Ambulatory Visit: Payer: Self-pay

## 2017-07-09 ENCOUNTER — Ambulatory Visit (INDEPENDENT_AMBULATORY_CARE_PROVIDER_SITE_OTHER): Payer: 59 | Admitting: Obstetrics

## 2017-07-09 VITALS — BP 116/76 | HR 70 | Ht 65.0 in | Wt 133.2 lb

## 2017-07-09 DIAGNOSIS — Z3009 Encounter for other general counseling and advice on contraception: Secondary | ICD-10-CM

## 2017-07-09 DIAGNOSIS — R87810 Cervical high risk human papillomavirus (HPV) DNA test positive: Secondary | ICD-10-CM | POA: Diagnosis not present

## 2017-07-09 DIAGNOSIS — Z0000 Encounter for general adult medical examination without abnormal findings: Secondary | ICD-10-CM

## 2017-07-09 DIAGNOSIS — Z3202 Encounter for pregnancy test, result negative: Secondary | ICD-10-CM | POA: Diagnosis not present

## 2017-07-09 DIAGNOSIS — J301 Allergic rhinitis due to pollen: Secondary | ICD-10-CM

## 2017-07-09 DIAGNOSIS — Z30011 Encounter for initial prescription of contraceptive pills: Secondary | ICD-10-CM | POA: Diagnosis not present

## 2017-07-09 DIAGNOSIS — Z01419 Encounter for gynecological examination (general) (routine) without abnormal findings: Secondary | ICD-10-CM | POA: Diagnosis not present

## 2017-07-09 DIAGNOSIS — Z01411 Encounter for gynecological examination (general) (routine) with abnormal findings: Secondary | ICD-10-CM | POA: Insufficient documentation

## 2017-07-09 DIAGNOSIS — R300 Dysuria: Secondary | ICD-10-CM | POA: Diagnosis not present

## 2017-07-09 LAB — POCT URINALYSIS DIPSTICK
BILIRUBIN UA: NEGATIVE
Glucose, UA: NEGATIVE
KETONES UA: NEGATIVE
Leukocytes, UA: NEGATIVE
Nitrite, UA: POSITIVE
Protein, UA: NEGATIVE
SPEC GRAV UA: 1.02 (ref 1.010–1.025)
UROBILINOGEN UA: 0.2 U/dL
pH, UA: 6.5 (ref 5.0–8.0)

## 2017-07-09 LAB — POCT URINE PREGNANCY: PREG TEST UR: NEGATIVE

## 2017-07-09 MED ORDER — PRENATAL PLUS 27-1 MG PO TABS: 1.0000 | ORAL_TABLET | Freq: Every day | ORAL | 11 refills | Status: DC

## 2017-07-09 MED ORDER — LEVONORGEST-ETH ESTRADIOL-IRON 0.1-20 MG-MCG(21) PO TABS: 1.0000 | ORAL_TABLET | Freq: Every day | ORAL | 11 refills | Status: DC

## 2017-07-09 MED ORDER — DESLORATADINE 5 MG PO TABS: 5.0000 mg | ORAL_TABLET | Freq: Every day | ORAL | 11 refills | Status: DC

## 2017-07-09 MED ORDER — NITROFURANTOIN MONOHYD MACRO 100 MG PO CAPS: 100.0000 mg | ORAL_CAPSULE | Freq: Two times a day (BID) | ORAL | 0 refills | Status: DC

## 2017-07-09 NOTE — Progress Notes (Signed)
Presents for AEX/PAP/Cultures.  C/o discomfort when voiding. Denies pain,NV, chills, fever.  Wants OCP.  UPT today is  NEGATIVE.

## 2017-07-09 NOTE — Progress Notes (Signed)
Subjective:        Adrienne [name] is a 32 y.o. female here for a routine exam.  Current complaints: Pain with urination.    Personal health questionnaire:  Is patient Ashkenazi Jewish, have a family history of breast and/or ovarian cancer: no Is there a family history of uterine cancer diagnosed at age < 34, gastrointestinal cancer, urinary tract cancer, family member who is a Personnel officer syndrome-associated carrier: no Is the patient overweight and hypertensive, family history of diabetes, personal history of gestational diabetes, preeclampsia or PCOS: no Is patient over 22, have PCOS,  family history of premature CHD under age 16, diabetes, smoke, have hypertension or peripheral artery disease:  no At any time, has a partner hit, kicked or otherwise hurt or frightened you?: no Over the past 2 weeks, have you felt down, depressed or hopeless?: no Over the past 2 weeks, have you felt little interest or pleasure in doing things?:no   Gynecologic History Patient's last menstrual period was 06/12/2017 (exact date). Contraception: Depo-Provera injections Last Pap: 2015. Results were: normal Last mammogram: n/a. Results were: n/a  Obstetric History OB History  Gravida Para Term Preterm AB Living  0 0 1  SAB TAB Ectopic Multiple Live Births  0 0 0 0 1    # Outcome Date GA Lbr Len/2nd Weight Sex Delivery Anes PTL Lv  1 Term 05/01/07 [redacted]w[redacted]d  8 lb 9 oz (3.884 kg) M Vag-Spont None  LIV    Past Medical History:  Diagnosis Date  . Allergy     History reviewed. No pertinent surgical history.   Current Outpatient Medications:  .  desloratadine (CLARINEX) 5 MG tablet, Take 1 tablet (5 mg total) by mouth daily. (Patient not taking: Reported on 07/09/2017), Disp: 30 tablet, Rfl: 11 .  medroxyPROGESTERone (DEPO-PROVERA) 150 MG/ML injection, INJECT 1 ML INTO THE MUSCLE EVERY 3 MONTHS (Patient not taking: Reported on 07/09/2017), Disp: 1 mL, Rfl: 3 No Known Allergies  Social History    Tobacco Use  . Smoking status: Former Smoker    Last attempt to quit: 09/08/2007    Years since quitting: 9.8  . Smokeless tobacco: Never Used  Substance Use Topics  . Alcohol use: Yes    Alcohol/week: 0.0 oz    Comment: occasionally     Family History  Problem Relation Age of Onset  . Hypertension Mother       Review of Systems  Constitutional: negative for fatigue and weight loss Respiratory: negative for cough and wheezing Cardiovascular: negative for chest pain, fatigue and palpitations Gastrointestinal: negative for abdominal pain and change in bowel habits Musculoskeletal:negative for myalgias Neurological: negative for gait problems and tremors Behavioral/Psych: negative for abusive relationship, depression Endocrine: negative for temperature intolerance    Genitourinary:negative for abnormal menstrual periods, genital lesions, hot flashes, sexual problems and vaginal discharge.  POSITIVE FOR PAIN WITH URINATION Integument/breast: negative for breast lump, breast tenderness, nipple discharge and skin lesion(s)    Objective:       BP 116/76   Pulse 70   Ht  (1.651 m)   Wt 133 lb 3.2 oz (60.4 kg)   LMP 06/12/2017 (Exact Date)   BMI 22.17 kg/m  General:   alert  Skin:   no rash or abnormalities  Lungs:   clear to auscultation bilaterally  Heart:   regular rate and rhythm, S1, S2 normal, no murmur, click, rub or gallop  Breasts:   normal without suspicious masses, skin or nipple changes  or axillary nodes  Abdomen:  normal findings: no organomegaly, soft, non-tender and no hernia  Pelvis:  External genitalia: normal general appearance Urinary system: urethral meatus normal and bladder without fullness, nontender Vaginal: normal without tenderness, induration or masses Cervix: normal appearance Adnexa: normal bimanual exam Uterus: anteverted and non-tender, normal size   Lab Review Urine pregnancy test Labs reviewed yes Radiologic studies reviewed  no  50% of 20 min visit spent on counseling and coordination of care.   Assessment:     1. Encounter for routine gynecological examination with Papanicolaou smear of cervix Rx: - Cytology - PAP - POCT urine pregnancy - Cervicovaginal ancillary only  2. Encounter for other general counseling or advice on contraception - wants OCP's  3. Encounter for initial prescription of contraceptive pills Rx: - Levonorgest-Eth Estrad-Fe Bisg (BALCOLTRA) 0.1-20 MG-MCG(21) TABS; Take 1 tablet by mouth daily before breakfast.  Dispense: 28 tablet; Refill: 11  4. Dysuria Rx: - nitrofurantoin, macrocrystal-monohydrate, (MACROBID) 100 MG capsule; Take 1 capsule (100 mg total) by mouth 2 (two) times daily.  Dispense: 14 capsule; Refill: 0  5. Routine adult health maintenance Rx: - prenatal vitamin w/FE, FA (PRENATAL 1 + 1) 27-1 MG TABS tablet; Take 1 tablet by mouth daily before breakfast.  Dispense: 30 each; Refill: 11    Plan:    Education reviewed: calcium supplements, depression evaluation, low fat, low cholesterol diet, safe sex/STD prevention, self breast exams and weight bearing exercise. Contraception: OCP (estrogen/progesterone). Follow up in: 1 year.   No orders of the defined types were placed in this encounter.  Orders Placed This Encounter  Procedures  . POCT urine pregnancy    Brock Bad MD 07-09-2017

## 2017-07-12 LAB — URINE CULTURE

## 2017-07-12 LAB — CERVICOVAGINAL ANCILLARY ONLY
Bacterial vaginitis: NEGATIVE
CANDIDA VAGINITIS: NEGATIVE
CHLAMYDIA, DNA PROBE: NEGATIVE
Neisseria Gonorrhea: NEGATIVE
Trichomonas: NEGATIVE

## 2017-07-14 DIAGNOSIS — J3081 Allergic rhinitis due to animal (cat) (dog) hair and dander: Secondary | ICD-10-CM | POA: Diagnosis not present

## 2017-07-14 DIAGNOSIS — J301 Allergic rhinitis due to pollen: Secondary | ICD-10-CM | POA: Diagnosis not present

## 2017-07-14 DIAGNOSIS — J3089 Other allergic rhinitis: Secondary | ICD-10-CM | POA: Diagnosis not present

## 2017-07-15 LAB — CYTOLOGY - PAP
DIAGNOSIS: NEGATIVE
HPV 16/18/45 genotyping: NEGATIVE
HPV: DETECTED — AB

## 2017-07-27 ENCOUNTER — Telehealth: Payer: Self-pay | Admitting: *Deleted

## 2017-07-27 ENCOUNTER — Other Ambulatory Visit: Payer: Self-pay | Admitting: Obstetrics

## 2017-07-27 DIAGNOSIS — Z30011 Encounter for initial prescription of contraceptive pills: Secondary | ICD-10-CM

## 2017-07-27 MED ORDER — NORGESTIMATE-ETH ESTRADIOL 0.25-35 MG-MCG PO TABS: 1.0000 | ORAL_TABLET | Freq: Every day | ORAL | 11 refills | Status: DC

## 2017-07-27 NOTE — Telephone Encounter (Signed)
Pt called to office for lab results. Pt made aware of results. Pt was tx for uti at time of visit.  Pt state she is still having urinary issues. Pt advised she may have appt to leave urine sample for culture in order to determine if uti is clear. Pt has been scheduled for Thursday afternoon.  Pt also ask about HPV on pap and any preventative measures. Pt made aware of Gardasil vaccine and advised she may want to consider.  Pt made aware will discuss with provider to verify pt approved for vaccine.   Pt also states that she does not [facility] given and [facility] to her pharmacy.  Pt would like to take Sprintec and would like [facility] Rx [facility] [address]  Pt made aware message to be sent to provider for Rx and vaccine recommendations.  Pt may expect return call later this week.   [facility] and Gardasil vaccine.

## 2017-07-27 NOTE — Telephone Encounter (Signed)
Sprintec 28 Rx and [facility] to [facility] [address]

## 2017-07-28 NOTE — Telephone Encounter (Signed)
Do you recommend pt start Gardasil series for +HPV on pap? Pt was concerned about this.

## 2017-07-29 ENCOUNTER — Ambulatory Visit: Payer: 59

## 2017-07-29 DIAGNOSIS — R8290 Unspecified abnormal findings in urine: Secondary | ICD-10-CM

## 2017-07-29 NOTE — Progress Notes (Signed)
Pt here complaining of cloudy urine. Pt just finished treatment for UTI 2 weeks ago. She states that she has been increasing her water intake, but no change in color.

## 2017-07-30 DIAGNOSIS — J301 Allergic rhinitis due to pollen: Secondary | ICD-10-CM | POA: Diagnosis not present

## 2017-07-30 DIAGNOSIS — J3081 Allergic rhinitis due to animal (cat) (dog) hair and dander: Secondary | ICD-10-CM | POA: Diagnosis not present

## 2017-07-30 DIAGNOSIS — J3089 Other allergic rhinitis: Secondary | ICD-10-CM | POA: Diagnosis not present

## 2017-08-02 ENCOUNTER — Other Ambulatory Visit: Payer: Self-pay | Admitting: Obstetrics

## 2017-08-02 LAB — URINE CULTURE

## 2017-08-20 DIAGNOSIS — J3089 Other allergic rhinitis: Secondary | ICD-10-CM | POA: Diagnosis not present

## 2017-08-20 DIAGNOSIS — J3081 Allergic rhinitis due to animal (cat) (dog) hair and dander: Secondary | ICD-10-CM | POA: Diagnosis not present

## 2017-09-23 ENCOUNTER — Other Ambulatory Visit: Payer: Self-pay | Admitting: Obstetrics

## 2017-09-23 ENCOUNTER — Ambulatory Visit (INDEPENDENT_AMBULATORY_CARE_PROVIDER_SITE_OTHER): Payer: 59

## 2017-09-23 DIAGNOSIS — N3000 Acute cystitis without hematuria: Secondary | ICD-10-CM

## 2017-09-23 DIAGNOSIS — R8290 Unspecified abnormal findings in urine: Secondary | ICD-10-CM | POA: Diagnosis not present

## 2017-09-23 DIAGNOSIS — Z0000 Encounter for general adult medical examination without abnormal findings: Secondary | ICD-10-CM

## 2017-09-23 LAB — POCT URINALYSIS DIPSTICK
BILIRUBIN UA: NEGATIVE
GLUCOSE UA: NEGATIVE
KETONES UA: NEGATIVE
Nitrite, UA: NEGATIVE
PH UA: 7 (ref 5.0–8.0)
Protein, UA: POSITIVE — AB
Spec Grav, UA: 1.015 (ref 1.010–1.025)
UROBILINOGEN UA: 0.2 U/dL

## 2017-09-23 MED ORDER — SULFAMETHOXAZOLE-TRIMETHOPRIM 800-160 MG PO TABS: 1.0000 | ORAL_TABLET | Freq: Two times a day (BID) | ORAL | 0 refills | Status: DC

## 2017-09-23 NOTE — Progress Notes (Signed)
Pt is here for nurse visit with c/o dysuria. Pt was last seen in 6/19 for same issue and given macrobid. Urine dip and culture done today. Will notify pt of results.

## 2017-09-26 ENCOUNTER — Other Ambulatory Visit: Payer: Self-pay | Admitting: Obstetrics

## 2017-09-26 DIAGNOSIS — N3090 Cystitis, unspecified without hematuria: Secondary | ICD-10-CM

## 2017-09-26 LAB — URINE CULTURE

## 2017-10-13 DIAGNOSIS — J3089 Other allergic rhinitis: Secondary | ICD-10-CM | POA: Diagnosis not present

## 2017-10-13 DIAGNOSIS — J3081 Allergic rhinitis due to animal (cat) (dog) hair and dander: Secondary | ICD-10-CM | POA: Diagnosis not present

## 2017-10-13 DIAGNOSIS — J301 Allergic rhinitis due to pollen: Secondary | ICD-10-CM | POA: Diagnosis not present

## 2017-10-15 DIAGNOSIS — J3089 Other allergic rhinitis: Secondary | ICD-10-CM | POA: Diagnosis not present

## 2017-10-15 DIAGNOSIS — J301 Allergic rhinitis due to pollen: Secondary | ICD-10-CM | POA: Diagnosis not present

## 2017-10-15 DIAGNOSIS — J3081 Allergic rhinitis due to animal (cat) (dog) hair and dander: Secondary | ICD-10-CM | POA: Diagnosis not present

## 2017-10-20 DIAGNOSIS — J3089 Other allergic rhinitis: Secondary | ICD-10-CM | POA: Diagnosis not present

## 2017-10-20 DIAGNOSIS — J301 Allergic rhinitis due to pollen: Secondary | ICD-10-CM | POA: Diagnosis not present

## 2017-10-20 DIAGNOSIS — J3081 Allergic rhinitis due to animal (cat) (dog) hair and dander: Secondary | ICD-10-CM | POA: Diagnosis not present

## 2017-10-22 DIAGNOSIS — J3081 Allergic rhinitis due to animal (cat) (dog) hair and dander: Secondary | ICD-10-CM | POA: Diagnosis not present

## 2017-10-22 DIAGNOSIS — J301 Allergic rhinitis due to pollen: Secondary | ICD-10-CM | POA: Diagnosis not present

## 2017-10-22 DIAGNOSIS — J3089 Other allergic rhinitis: Secondary | ICD-10-CM | POA: Diagnosis not present

## 2017-10-29 DIAGNOSIS — J3081 Allergic rhinitis due to animal (cat) (dog) hair and dander: Secondary | ICD-10-CM | POA: Diagnosis not present

## 2017-10-29 DIAGNOSIS — J301 Allergic rhinitis due to pollen: Secondary | ICD-10-CM | POA: Diagnosis not present

## 2017-10-29 DIAGNOSIS — J3089 Other allergic rhinitis: Secondary | ICD-10-CM | POA: Diagnosis not present

## 2017-11-03 DIAGNOSIS — J3081 Allergic rhinitis due to animal (cat) (dog) hair and dander: Secondary | ICD-10-CM | POA: Diagnosis not present

## 2017-11-03 DIAGNOSIS — J301 Allergic rhinitis due to pollen: Secondary | ICD-10-CM | POA: Diagnosis not present

## 2017-11-03 DIAGNOSIS — J3089 Other allergic rhinitis: Secondary | ICD-10-CM | POA: Diagnosis not present

## 2017-11-10 DIAGNOSIS — J301 Allergic rhinitis due to pollen: Secondary | ICD-10-CM | POA: Diagnosis not present

## 2017-11-10 DIAGNOSIS — J3081 Allergic rhinitis due to animal (cat) (dog) hair and dander: Secondary | ICD-10-CM | POA: Diagnosis not present

## 2017-11-10 DIAGNOSIS — J3089 Other allergic rhinitis: Secondary | ICD-10-CM | POA: Diagnosis not present

## 2017-11-17 DIAGNOSIS — J301 Allergic rhinitis due to pollen: Secondary | ICD-10-CM | POA: Diagnosis not present

## 2017-11-17 DIAGNOSIS — J3081 Allergic rhinitis due to animal (cat) (dog) hair and dander: Secondary | ICD-10-CM | POA: Diagnosis not present

## 2017-11-17 DIAGNOSIS — J3089 Other allergic rhinitis: Secondary | ICD-10-CM | POA: Diagnosis not present

## 2017-11-24 DIAGNOSIS — J3089 Other allergic rhinitis: Secondary | ICD-10-CM | POA: Diagnosis not present

## 2017-11-24 DIAGNOSIS — J3081 Allergic rhinitis due to animal (cat) (dog) hair and dander: Secondary | ICD-10-CM | POA: Diagnosis not present

## 2017-11-24 DIAGNOSIS — J301 Allergic rhinitis due to pollen: Secondary | ICD-10-CM | POA: Diagnosis not present

## 2017-12-01 DIAGNOSIS — J3081 Allergic rhinitis due to animal (cat) (dog) hair and dander: Secondary | ICD-10-CM | POA: Diagnosis not present

## 2017-12-01 DIAGNOSIS — J3089 Other allergic rhinitis: Secondary | ICD-10-CM | POA: Diagnosis not present

## 2017-12-01 DIAGNOSIS — J301 Allergic rhinitis due to pollen: Secondary | ICD-10-CM | POA: Diagnosis not present

## 2017-12-08 DIAGNOSIS — J3081 Allergic rhinitis due to animal (cat) (dog) hair and dander: Secondary | ICD-10-CM | POA: Diagnosis not present

## 2017-12-08 DIAGNOSIS — J301 Allergic rhinitis due to pollen: Secondary | ICD-10-CM | POA: Diagnosis not present

## 2017-12-08 DIAGNOSIS — J3089 Other allergic rhinitis: Secondary | ICD-10-CM | POA: Diagnosis not present

## 2017-12-17 DIAGNOSIS — J301 Allergic rhinitis due to pollen: Secondary | ICD-10-CM | POA: Diagnosis not present

## 2017-12-17 DIAGNOSIS — J3089 Other allergic rhinitis: Secondary | ICD-10-CM | POA: Diagnosis not present

## 2017-12-17 DIAGNOSIS — J3081 Allergic rhinitis due to animal (cat) (dog) hair and dander: Secondary | ICD-10-CM | POA: Diagnosis not present

## 2017-12-22 DIAGNOSIS — J3089 Other allergic rhinitis: Secondary | ICD-10-CM | POA: Diagnosis not present

## 2017-12-22 DIAGNOSIS — J3081 Allergic rhinitis due to animal (cat) (dog) hair and dander: Secondary | ICD-10-CM | POA: Diagnosis not present

## 2017-12-22 DIAGNOSIS — J301 Allergic rhinitis due to pollen: Secondary | ICD-10-CM | POA: Diagnosis not present

## 2017-12-29 DIAGNOSIS — J3081 Allergic rhinitis due to animal (cat) (dog) hair and dander: Secondary | ICD-10-CM | POA: Diagnosis not present

## 2017-12-29 DIAGNOSIS — J3089 Other allergic rhinitis: Secondary | ICD-10-CM | POA: Diagnosis not present

## 2017-12-29 DIAGNOSIS — J301 Allergic rhinitis due to pollen: Secondary | ICD-10-CM | POA: Diagnosis not present

## 2018-01-12 DIAGNOSIS — J3089 Other allergic rhinitis: Secondary | ICD-10-CM | POA: Diagnosis not present

## 2018-01-12 DIAGNOSIS — J3081 Allergic rhinitis due to animal (cat) (dog) hair and dander: Secondary | ICD-10-CM | POA: Diagnosis not present

## 2018-01-12 DIAGNOSIS — J301 Allergic rhinitis due to pollen: Secondary | ICD-10-CM | POA: Diagnosis not present

## 2018-01-27 DIAGNOSIS — J3089 Other allergic rhinitis: Secondary | ICD-10-CM | POA: Diagnosis not present

## 2018-01-27 DIAGNOSIS — J301 Allergic rhinitis due to pollen: Secondary | ICD-10-CM | POA: Diagnosis not present

## 2018-01-27 DIAGNOSIS — J3081 Allergic rhinitis due to animal (cat) (dog) hair and dander: Secondary | ICD-10-CM | POA: Diagnosis not present

## 2018-02-02 DIAGNOSIS — J3081 Allergic rhinitis due to animal (cat) (dog) hair and dander: Secondary | ICD-10-CM | POA: Diagnosis not present

## 2018-02-02 DIAGNOSIS — J3089 Other allergic rhinitis: Secondary | ICD-10-CM | POA: Diagnosis not present

## 2018-02-02 DIAGNOSIS — J301 Allergic rhinitis due to pollen: Secondary | ICD-10-CM | POA: Diagnosis not present

## 2018-02-09 DIAGNOSIS — J301 Allergic rhinitis due to pollen: Secondary | ICD-10-CM | POA: Diagnosis not present

## 2018-02-09 DIAGNOSIS — J3081 Allergic rhinitis due to animal (cat) (dog) hair and dander: Secondary | ICD-10-CM | POA: Diagnosis not present

## 2018-02-09 DIAGNOSIS — J3089 Other allergic rhinitis: Secondary | ICD-10-CM | POA: Diagnosis not present

## 2018-05-14 ENCOUNTER — Other Ambulatory Visit: Payer: Self-pay | Admitting: Obstetrics

## 2018-05-14 DIAGNOSIS — Z30011 Encounter for initial prescription of contraceptive pills: Secondary | ICD-10-CM

## 2018-07-29 ENCOUNTER — Ambulatory Visit (INDEPENDENT_AMBULATORY_CARE_PROVIDER_SITE_OTHER): Payer: Self-pay

## 2018-07-29 ENCOUNTER — Other Ambulatory Visit: Payer: Self-pay

## 2018-07-29 VITALS — BP 121/78 | HR 88 | Wt 142.9 lb

## 2018-07-29 DIAGNOSIS — N3000 Acute cystitis without hematuria: Secondary | ICD-10-CM

## 2018-07-29 DIAGNOSIS — N3001 Acute cystitis with hematuria: Secondary | ICD-10-CM

## 2018-07-29 MED ORDER — SULFAMETHOXAZOLE-TRIMETHOPRIM 800-160 MG PO TABS: 1.0000 | ORAL_TABLET | Freq: Two times a day (BID) | ORAL | 0 refills | Status: DC

## 2018-07-29 NOTE — Progress Notes (Addendum)
SUBJECTIVE: Adrienne [name] is a 33 y.o. female who complains of urinary frequency, urgency and dysuria x 7 days, without flank pain, fever, chills, or abnormal vaginal discharge or bleeding.   OBJECTIVE: Appears well, in no apparent distress.  Vital signs are normal. Urine dipstick shows: Unable to read as patient is taking AZO and turned Dip Stick ORANGE.    ASSESSMENT: Dysuria  PLAN: Urine sent for Culture. Rx sent to Pharmacy. Treatment per orders.  Call or return to clinic prn if these symptoms worsen or fail to improve as anticipated.   I have reviewed the chart and agree with nursing staff's documentation of this patient's encounter.  Coral Ceo, MD 07/29/2018 9:56 AM

## 2018-08-01 ENCOUNTER — Other Ambulatory Visit: Payer: Self-pay | Admitting: Obstetrics

## 2018-08-01 DIAGNOSIS — N3001 Acute cystitis with hematuria: Secondary | ICD-10-CM

## 2018-08-01 LAB — URINE CULTURE

## 2018-08-01 MED ORDER — AMOXICILLIN-POT CLAVULANATE 875-125 MG PO TABS: 1.0000 | ORAL_TABLET | Freq: Two times a day (BID) | ORAL | 0 refills | Status: DC

## 2018-08-03 ENCOUNTER — Telehealth: Payer: Self-pay

## 2018-08-03 NOTE — Telephone Encounter (Signed)
Returned call and pt wanted to know if UPT was done at last visit, advised that it was not.

## 2018-08-18 ENCOUNTER — Telehealth: Payer: Self-pay

## 2018-08-18 NOTE — Telephone Encounter (Signed)
Returned pt's call. She did not leave a detailed VM.  LVM for pt to c/b if she still needs assistance.

## 2018-08-23 ENCOUNTER — Other Ambulatory Visit: Payer: Self-pay | Admitting: Obstetrics

## 2018-08-25 ENCOUNTER — Telehealth: Payer: Self-pay

## 2018-08-25 NOTE — Telephone Encounter (Signed)
Pt called requesting if we could refill Latisse drops for her she states where she was originally getting them filled they have recently relocated out of the area. Dr.Harper is currently out of the office. Her next Annual will be in August here in the office.

## 2018-08-25 NOTE — Telephone Encounter (Signed)
Provider was Loman [place] she has joined a office in [place], [place]   She stated she will not be able to get a appointment with them She stated since she would be coming to [place] for a annual soon could we feel these for her.

## 2018-08-29 NOTE — Telephone Encounter (Signed)
Understood.  Thanks.

## 2018-09-28 ENCOUNTER — Encounter

## 2018-09-30 ENCOUNTER — Ambulatory Visit: Payer: Medicaid Other | Admitting: Obstetrics

## 2018-09-30 ENCOUNTER — Other Ambulatory Visit: Payer: Self-pay

## 2018-09-30 ENCOUNTER — Encounter: Payer: Self-pay | Admitting: Obstetrics

## 2018-09-30 VITALS — BP 126/76 | HR 60 | Temp 97.6°F | Wt 141.9 lb

## 2018-09-30 NOTE — Progress Notes (Signed)
Patient is in the office for annual, states that here cycle is currently on and its heavy, provider advised pt to reschedule appt.

## 2018-10-01 NOTE — Progress Notes (Signed)
Appointment cancelled

## 2018-10-07 ENCOUNTER — Other Ambulatory Visit: Payer: Self-pay

## 2018-10-07 ENCOUNTER — Other Ambulatory Visit (HOSPITAL_COMMUNITY)
Admission: RE | Admit: 2018-10-07 | Discharge: 2018-10-07 | Disposition: A | Discharge status: Home / Self Care | Payer: Managed Care, Other (non HMO) | Source: Ambulatory Visit | Attending: Obstetrics | Admitting: Obstetrics

## 2018-10-07 ENCOUNTER — Encounter: Payer: Self-pay | Admitting: Obstetrics

## 2018-10-07 ENCOUNTER — Ambulatory Visit (INDEPENDENT_AMBULATORY_CARE_PROVIDER_SITE_OTHER): Payer: Managed Care, Other (non HMO) | Admitting: Obstetrics

## 2018-10-07 VITALS — BP 117/81 | HR 76 | Temp 98.2°F | Wt 139.0 lb

## 2018-10-07 DIAGNOSIS — Z01419 Encounter for gynecological examination (general) (routine) without abnormal findings: Secondary | ICD-10-CM | POA: Diagnosis not present

## 2018-10-07 DIAGNOSIS — H02729 Madarosis of unspecified eye, unspecified eyelid and periocular area: Secondary | ICD-10-CM

## 2018-10-07 DIAGNOSIS — Z0000 Encounter for general adult medical examination without abnormal findings: Secondary | ICD-10-CM

## 2018-10-07 DIAGNOSIS — N898 Other specified noninflammatory disorders of vagina: Secondary | ICD-10-CM

## 2018-10-07 DIAGNOSIS — Z30011 Encounter for initial prescription of contraceptive pills: Secondary | ICD-10-CM

## 2018-10-07 DIAGNOSIS — J301 Allergic rhinitis due to pollen: Secondary | ICD-10-CM

## 2018-10-07 DIAGNOSIS — Z113 Encounter for screening for infections with a predominantly sexual mode of transmission: Secondary | ICD-10-CM

## 2018-10-07 MED ORDER — BIMATOPROST 0.03 % OP SOLN: 1.0000 [drp] | Freq: Every day | OPHTHALMIC | 4 refills | Status: DC

## 2018-10-07 MED ORDER — NORGESTIMATE-ETH ESTRADIOL 0.25-35 MG-MCG PO TABS: 1.0000 | ORAL_TABLET | Freq: Every day | ORAL | 3 refills | Status: DC

## 2018-10-07 MED ORDER — PRENATAL PLUS 27-1 MG PO TABS: 1.0000 | ORAL_TABLET | Freq: Every day | ORAL | 4 refills | Status: DC

## 2018-10-07 MED ORDER — DESLORATADINE 5 MG PO TABS: 5.0000 mg | ORAL_TABLET | Freq: Every day | ORAL | 11 refills | Status: DC

## 2018-10-07 NOTE — Progress Notes (Signed)
Subjective:        Adrienne [name] is a 33 y.o. female here for a routine exam.  Current complaints: None.    Personal health questionnaire:  Is patient Ashkenazi Jewish, have a family history of breast and/or ovarian cancer: no Is there a family history of uterine cancer diagnosed at age < 6550, gastrointestinal cancer, urinary tract cancer, family member who is a Personnel officerLynch syndrome-associated carrier: no Is the patient overweight and hypertensive, family history of diabetes, personal history of gestational diabetes, preeclampsia or PCOS: no Is patient over 2755, have PCOS,  family history of premature CHD under age 31, diabetes, smoke, have hypertension or peripheral artery disease:  no At any time, has a partner hit, kicked or otherwise hurt or frightened you?: no Over the past 2 weeks, have you felt down, depressed or hopeless?: no Over the past 2 weeks, have you felt little interest or pleasure in doing things?:no   Gynecologic History Patient's last menstrual period was 10/02/2018. Contraception: OCP (estrogen/progesterone) Last Pap: 07-09-2017. Results were: normal Last mammogram: n/a. Results were: n/a  Obstetric History OB History  Gravida Para Term Preterm AB Living  1 1 1  0 0 1  SAB TAB Ectopic Multiple Live Births  0 0 0 0 1    # Outcome Date GA Lbr Len/2nd Weight Sex Delivery Anes PTL Lv  1 Term 05/01/07 [id number]  8 lb 9 oz (3.884 kg) M Vag-Spont None  LIV    Past Medical History:  Diagnosis Date  . Allergy     History reviewed. No pertinent surgical history.   Current Outpatient Medications:  .  desloratadine (CLARINEX) 5 MG tablet, Take 1 tablet (5 mg total) by mouth daily., Disp: 30 tablet, Rfl: 11 .  norgestimate-ethinyl estradiol (SPRINTEC 28) 0.25-35 MG-MCG tablet, Take 1 tablet by mouth daily., Disp: 1 Package, Rfl: 3 .  amoxicillin-clavulanate (AUGMENTIN) 875-125 MG tablet, Take 1 tablet by mouth 2 (two) times daily. (Patient not taking: Reported on 09/30/2018),  Disp: 14 tablet, Rfl: 0 .  bimatoprost (LUMIGAN) 0.03 % ophthalmic solution, Place 1 drop into both eyes at bedtime., Disp: 5 mL, Rfl: 4 .  medroxyPROGESTERone (DEPO-PROVERA) 150 MG/ML injection, INJECT 1 ML INTO THE MUSCLE EVERY 3 MONTHS (Patient not taking: Reported on 07/09/2017), Disp: 1 mL, Rfl: 3 .  nitrofurantoin, macrocrystal-monohydrate, (MACROBID) 100 MG capsule, Take 1 capsule (100 mg total) by mouth 2 (two) times daily. (Patient not taking: Reported on 09/30/2018), Disp: 14 capsule, Rfl: 0 .  prenatal vitamin w/FE, FA (PRENATAL 1 + 1) 27-1 MG TABS tablet, Take 1 tablet by mouth daily before breakfast., Disp: 90 tablet, Rfl: 4 .  sulfamethoxazole-trimethoprim (BACTRIM DS) 800-160 MG tablet, Take 1 tablet by mouth 2 (two) times daily. (Patient not taking: Reported on 09/30/2018), Disp: 14 tablet, Rfl: 0 Not on File  Social History   Tobacco Use  . Smoking status: Former Smoker    Quit date: 09/08/2007    Years since quitting: 11.0  . Smokeless tobacco: Never Used  Substance Use Topics  . Alcohol use: Yes    Alcohol/week: 0.0 standard drinks    Comment: occasionally     Family History  Problem Relation Age of Onset  . Hypertension Mother       Review of Systems  Constitutional: negative for fatigue and weight loss Respiratory: negative for cough and wheezing Cardiovascular: negative for chest pain, fatigue and palpitations Gastrointestinal: negative for abdominal pain and change in bowel habits Musculoskeletal:negative for myalgias Neurological: negative for  gait problems and tremors Behavioral/Psych: negative for abusive relationship, depression Endocrine: negative for temperature intolerance    Genitourinary:negative for abnormal menstrual periods, genital lesions, hot flashes, sexual problems and vaginal discharge Integument/breast: negative for breast lump, breast tenderness, nipple discharge and skin lesion(s)    Objective:       BP 117/81   Pulse 76   Temp 98.2 F  (36.8 C)   Wt 139 lb (63 kg)   LMP 10/02/2018   BMI 23.13 kg/m  General:   alert  Skin:   no rash or abnormalities  Lungs:   clear to auscultation bilaterally  Heart:   regular rate and rhythm, S1, S2 normal, no murmur, click, rub or gallop  Breasts:   normal without suspicious masses, skin or nipple changes or axillary nodes  Abdomen:  normal findings: no organomegaly, soft, non-tender and no hernia  Pelvis:  External genitalia: normal general appearance Urinary system: urethral meatus normal and bladder without fullness, nontender Vaginal: normal without tenderness, induration or masses Cervix: normal appearance Adnexa: normal bimanual exam Uterus: anteverted and non-tender, normal size   Lab Review Urine pregnancy test Labs reviewed yes Radiologic studies reviewed yes  50% of 25 min visit spent on counseling and coordination of care.   Assessment:     1. Encounter for routine gynecological examination with Papanicolaou smear of cervix Rx: - Cytology - PAP( [facility])  2. Vaginal discharge Rx: - Cervicovaginal ancillary only  3. Screen for STD (sexually transmitted disease) Rx: - Hepatitis B surface antigen - Hepatitis C antibody - HIV Antibody (routine testing w rflx) - RPR  4. Encounter for initial prescription of contraceptive pills Rx: - norgestimate-ethinyl estradiol (SPRINTEC 28) 0.25-35 MG-MCG tablet; Take 1 tablet by mouth daily.  Dispense: 1 Package; Refill: 3  5. Hypotrichosis of eyelid, unspecified laterality Rx: - bimatoprost (LUMIGAN) 0.03 % ophthalmic solution; Place 1 drop into both eyes at bedtime.  Dispense: 5 mL; Refill: 4  6. Routine adult health maintenance Rx: - prenatal vitamin w/FE, FA (PRENATAL 1 + 1) 27-1 MG TABS tablet; Take 1 tablet by mouth daily before breakfast.  Dispense: 90 tablet; Refill: 4  7. Seasonal allergic rhinitis due to pollen Rx: - desloratadine (CLARINEX) 5 MG tablet; Take 1 tablet (5 mg total) by mouth daily.   Dispense: 30 tablet; Refill: 11    Plan:    Education reviewed: calcium supplements, depression evaluation, low fat, low cholesterol diet, safe sex/STD prevention, self breast exams and weight bearing exercise. Contraception: OCP (estrogen/progesterone). Follow up in: 1 year.   Meds ordered this encounter  Medications  . norgestimate-ethinyl estradiol (SPRINTEC 28) 0.25-35 MG-MCG tablet    Sig: Take 1 tablet by mouth daily.    Dispense:  1 Package    Refill:  3  . bimatoprost (LUMIGAN) 0.03 % ophthalmic solution    Sig: Place 1 drop into both eyes at bedtime.    Dispense:  5 mL    Refill:  4  . prenatal vitamin w/FE, FA (PRENATAL 1 + 1) 27-1 MG TABS tablet    Sig: Take 1 tablet by mouth daily before breakfast.    Dispense:  90 tablet    Refill:  4  . desloratadine (CLARINEX) 5 MG tablet    Sig: Take 1 tablet (5 mg total) by mouth daily.    Dispense:  30 tablet    Refill:  11   Orders Placed This Encounter  Procedures  . Hepatitis B surface antigen  . Hepatitis C antibody  .  HIV Antibody (routine testing w rflx)  . RPR    Shelly Bombard, MD 10/07/2018 10:30 AM

## 2018-10-07 NOTE — Progress Notes (Signed)
Pt presents for annual and all STD blood work today.  Pt requests rx for Latisse and needs rf on Sprintec.

## 2018-10-08 LAB — HEPATITIS C ANTIBODY: Hep C Virus Ab: <0.1 s/co ratio (ref 0.0–0.9)

## 2018-10-08 LAB — HIV ANTIBODY (ROUTINE TESTING W REFLEX): HIV Screen 4th Generation wRfx: NONREACTIVE

## 2018-10-08 LAB — HEPATITIS B SURFACE ANTIGEN: Hepatitis B Surface Ag: NEGATIVE

## 2018-10-08 LAB — RPR: RPR Ser Ql: NONREACTIVE

## 2018-10-11 LAB — CERVICOVAGINAL ANCILLARY ONLY
Bacterial vaginitis: POSITIVE — AB
Candida vaginitis: NEGATIVE
Chlamydia: NEGATIVE
Neisseria Gonorrhea: NEGATIVE
Trichomonas: NEGATIVE

## 2018-10-11 LAB — CYTOLOGY - PAP
Diagnosis: UNDETERMINED — AB
HPV: NOT DETECTED

## 2018-10-12 ENCOUNTER — Other Ambulatory Visit: Payer: Self-pay | Admitting: Obstetrics

## 2018-10-12 ENCOUNTER — Telehealth: Payer: Self-pay

## 2018-10-12 DIAGNOSIS — B9689 Other specified bacterial agents as the cause of diseases classified elsewhere: Secondary | ICD-10-CM

## 2018-10-12 DIAGNOSIS — N760 Acute vaginitis: Secondary | ICD-10-CM

## 2018-10-12 MED ORDER — TINIDAZOLE 500 MG PO TABS: 1000.0000 mg | ORAL_TABLET | Freq: Every day | ORAL | 2 refills | Status: DC

## 2018-10-12 NOTE — Telephone Encounter (Signed)
Patient calling regarding recent results from last visit with you.  Please advise.

## 2018-10-13 ENCOUNTER — Telehealth: Payer: Self-pay

## 2018-10-13 ENCOUNTER — Other Ambulatory Visit: Payer: Self-pay

## 2018-10-13 NOTE — Progress Notes (Signed)
Error

## 2018-10-13 NOTE — Telephone Encounter (Signed)
The patient called and states that the eye drops that were sent for her (Bimatoprost) were supposed to be actually Latisse. Please confirm which medication she is supposed to be getting. Thank you!

## 2018-10-14 ENCOUNTER — Other Ambulatory Visit: Payer: Self-pay | Admitting: Obstetrics

## 2018-10-14 NOTE — Telephone Encounter (Signed)
Bimatoprost is the actual chemical name for Latisse or Lumigan, which are the brands.

## 2019-02-13 ENCOUNTER — Other Ambulatory Visit: Payer: Self-pay | Admitting: Obstetrics

## 2019-02-13 DIAGNOSIS — Z30011 Encounter for initial prescription of contraceptive pills: Secondary | ICD-10-CM

## 2019-02-14 ENCOUNTER — Other Ambulatory Visit: Payer: Self-pay

## 2019-02-14 DIAGNOSIS — Z30011 Encounter for initial prescription of contraceptive pills: Secondary | ICD-10-CM

## 2019-02-14 MED ORDER — NORGESTIMATE-ETH ESTRADIOL 0.25-35 MG-MCG PO TABS: 1.0000 | ORAL_TABLET | Freq: Every day | ORAL | 4 refills | Status: DC

## 2019-04-25 ENCOUNTER — Encounter: Payer: Self-pay | Admitting: Obstetrics

## 2019-04-25 ENCOUNTER — Other Ambulatory Visit: Payer: Self-pay

## 2019-04-25 ENCOUNTER — Ambulatory Visit (INDEPENDENT_AMBULATORY_CARE_PROVIDER_SITE_OTHER): Payer: Managed Care, Other (non HMO) | Admitting: Obstetrics

## 2019-04-25 VITALS — BP 122/85 | HR 72 | Wt 138.8 lb

## 2019-04-25 DIAGNOSIS — N898 Other specified noninflammatory disorders of vagina: Secondary | ICD-10-CM

## 2019-04-25 DIAGNOSIS — N760 Acute vaginitis: Secondary | ICD-10-CM

## 2019-04-25 DIAGNOSIS — Z113 Encounter for screening for infections with a predominantly sexual mode of transmission: Secondary | ICD-10-CM | POA: Diagnosis not present

## 2019-04-25 DIAGNOSIS — B9689 Other specified bacterial agents as the cause of diseases classified elsewhere: Secondary | ICD-10-CM | POA: Diagnosis not present

## 2019-04-25 DIAGNOSIS — Z114 Encounter for screening for human immunodeficiency virus [HIV]: Secondary | ICD-10-CM

## 2019-04-25 DIAGNOSIS — Z87440 Personal history of urinary (tract) infections: Secondary | ICD-10-CM | POA: Diagnosis not present

## 2019-04-25 DIAGNOSIS — Z87891 Personal history of nicotine dependence: Secondary | ICD-10-CM

## 2019-04-25 DIAGNOSIS — L738 Other specified follicular disorders: Secondary | ICD-10-CM | POA: Diagnosis not present

## 2019-04-25 LAB — POCT URINALYSIS DIPSTICK
Bilirubin, UA: NEGATIVE
Glucose, UA: NEGATIVE
Ketones, UA: NEGATIVE
Nitrite, UA: NEGATIVE
Protein, UA: NEGATIVE
Spec Grav, UA: 1.01 (ref 1.010–1.025)
Urobilinogen, UA: 0.2 E.U./dL
pH, UA: 7 (ref 5.0–8.0)

## 2019-04-25 NOTE — Progress Notes (Signed)
Pt reports that she recently shaved her vaginal are this weekend and now she now is experiencing small bumps on the right side.

## 2019-04-25 NOTE — Progress Notes (Signed)
Patient ID: Adrienne [name], female   DOB: 15-Dec-1985, 34 y.o.   MRN: [id number]  Chief Complaint  Patient presents with  . Vaginitis    HPI Adrienne [name] is a 34 y.o. female.  Complains of vulva irritation and cluster of non-painful " bumps " after shaving. HPI  Past Medical History:  Diagnosis Date  . Allergy     History reviewed. No pertinent surgical history.  Family History  Problem Relation Age of Onset  . Hypertension Mother     Social History Social History   Tobacco Use  . Smoking status: Former Smoker    Quit date: 09/08/2007    Years since quitting: 11.6  . Smokeless tobacco: Never Used  Substance Use Topics  . Alcohol use: Yes    Alcohol/week: 0.0 standard drinks    Comment: occasionally   . Drug use: No    Not on File  Current Outpatient Medications  Medication Sig Dispense Refill  . bimatoprost (LUMIGAN) 0.03 % ophthalmic solution Place 1 drop into both eyes at bedtime. 5 mL 4  . desloratadine (CLARINEX) 5 MG tablet Take 1 tablet (5 mg total) by mouth daily. 30 tablet 11  . norgestimate-ethinyl estradiol (SPRINTEC 28) 0.25-35 MG-MCG tablet Take 1 tablet by mouth daily. 84 tablet 4  . prenatal vitamin w/FE, FA (PRENATAL 1 + 1) 27-1 MG TABS tablet Take 1 tablet by mouth daily before breakfast. 90 tablet 4  . amoxicillin-clavulanate (AUGMENTIN) 875-125 MG tablet Take 1 tablet by mouth 2 (two) times daily. (Patient not taking: Reported on 09/30/2018) 14 tablet 0  . medroxyPROGESTERone (DEPO-PROVERA) 150 MG/ML injection INJECT 1 ML INTO THE MUSCLE EVERY 3 MONTHS (Patient not taking: Reported on 07/09/2017) 1 mL 3  . nitrofurantoin, macrocrystal-monohydrate, (MACROBID) 100 MG capsule Take 1 capsule (100 mg total) by mouth 2 (two) times daily. (Patient not taking: Reported on 09/30/2018) 14 capsule 0  . sulfamethoxazole-trimethoprim (BACTRIM DS) 800-160 MG tablet Take 1 tablet by mouth 2 (two) times daily. (Patient not taking: Reported on 09/30/2018) 14 tablet 0  .  tinidazole (TINDAMAX) 500 MG tablet Take 2 tablets (1,000 mg total) by mouth daily with breakfast. (Patient not taking: Reported on 04/25/2019) 10 tablet 2   No current facility-administered medications for this visit.    Review of Systems Review of Systems Constitutional: negative for fatigue and weight loss Respiratory: negative for cough and wheezing Cardiovascular: negative for chest pain, fatigue and palpitations Gastrointestinal: negative for abdominal pain and change in bowel habits Genitourinary:positive for vulva irritation and bumps after shaving Integument/breast: negative for nipple discharge Musculoskeletal:negative for myalgias Neurological: negative for gait problems and tremors Behavioral/Psych: negative for abusive relationship, depression Endocrine: negative for temperature intolerance      Blood pressure 122/85, pulse 72, weight 138 lb 12.8 oz (63 kg), last menstrual period 04/11/2019.  Physical Exam Physical Exam                General:  Alert and no distress               Respiratory:  Normal respiratory effort               Mental:  Normal affect and mood Pelvis:  External genitalia: cluster of vesicles right vulva  Urinary system: urethral meatus normal and bladder without fullness, nontender Vaginal: normal without tenderness, induration or masses Cervix: normal appearance Adnexa: normal bimanual exam Uterus: anteverted and non-tender, normal size    50% of 20 min visit spent on counseling and  coordination of care.   Data Reviewed Previous labs Wet Prep  Assessment     1. Folliculitis barbae.  R/O Herpes Rx: - Herpes simplex virus culture  2. Vaginal discharge Rx: - Cervicovaginal ancillary only( [facility])  3. Screen for STD (sexually transmitted disease) Rx: - Hepatitis B surface antigen - Hepatitis C antibody - HIV Antibody (routine testing w rflx) - RPR  4. History of UTI Rx: - POCT Urinalysis Dipstick - Urine Culture    Plan   Follow up in 6 months for Annual / Pap  Orders Placed This Encounter  Procedures  . Herpes simplex virus culture  . Urine Culture  . Hepatitis B surface antigen  . Hepatitis C antibody  . HIV Antibody (routine testing w rflx)  . RPR  . POCT Urinalysis Dipstick     Brock Bad, MD 04/25/2019 2:46 PM

## 2019-04-26 LAB — HEPATITIS C ANTIBODY: Hep C Virus Ab: <0.1 s/co ratio (ref 0.0–0.9)

## 2019-04-26 LAB — HIV ANTIBODY (ROUTINE TESTING W REFLEX): HIV Screen 4th Generation wRfx: NONREACTIVE

## 2019-04-26 LAB — RPR: RPR Ser Ql: NONREACTIVE

## 2019-04-26 LAB — HEPATITIS B SURFACE ANTIGEN: Hepatitis B Surface Ag: NEGATIVE

## 2019-04-27 LAB — URINE CULTURE: Organism ID, Bacteria: NO GROWTH

## 2019-04-28 LAB — HERPES SIMPLEX VIRUS CULTURE

## 2019-05-01 ENCOUNTER — Other Ambulatory Visit: Payer: Self-pay | Admitting: Obstetrics

## 2019-05-01 DIAGNOSIS — A6004 Herpesviral vulvovaginitis: Secondary | ICD-10-CM

## 2019-05-01 MED ORDER — VALACYCLOVIR HCL 1 G PO TABS: ORAL_TABLET | ORAL | 12 refills | Status: DC

## 2019-05-03 LAB — CERVICOVAGINAL ANCILLARY ONLY
Bacterial Vaginitis (gardnerella): POSITIVE — AB
Chlamydia: NEGATIVE
Comment: NEGATIVE
Comment: NEGATIVE
Comment: NORMAL
Neisseria Gonorrhea: NEGATIVE

## 2019-05-04 ENCOUNTER — Telehealth: Payer: Self-pay

## 2019-05-04 ENCOUNTER — Other Ambulatory Visit: Payer: Self-pay | Admitting: Obstetrics

## 2019-05-04 DIAGNOSIS — B9689 Other specified bacterial agents as the cause of diseases classified elsewhere: Secondary | ICD-10-CM

## 2019-05-04 DIAGNOSIS — N760 Acute vaginitis: Secondary | ICD-10-CM

## 2019-05-04 MED ORDER — TINIDAZOLE 500 MG PO TABS: 1000.0000 mg | ORAL_TABLET | Freq: Every day | ORAL | 2 refills | Status: DC

## 2019-05-04 NOTE — Telephone Encounter (Signed)
Called and advised of results and rx sent.

## 2019-11-09 ENCOUNTER — Other Ambulatory Visit: Payer: Self-pay

## 2019-11-09 ENCOUNTER — Encounter: Payer: Self-pay | Admitting: Obstetrics

## 2019-11-09 ENCOUNTER — Other Ambulatory Visit (HOSPITAL_COMMUNITY)
Admission: RE | Admit: 2019-11-09 | Discharge: 2019-11-09 | Disposition: A | Discharge status: Home / Self Care | Payer: Managed Care, Other (non HMO) | Source: Ambulatory Visit | Attending: Obstetrics | Admitting: Obstetrics

## 2019-11-09 ENCOUNTER — Ambulatory Visit (INDEPENDENT_AMBULATORY_CARE_PROVIDER_SITE_OTHER): Payer: Managed Care, Other (non HMO) | Admitting: Obstetrics

## 2019-11-09 VITALS — BP 121/86 | HR 72 | Ht 65.5 in | Wt 153.5 lb

## 2019-11-09 DIAGNOSIS — Z01419 Encounter for gynecological examination (general) (routine) without abnormal findings: Secondary | ICD-10-CM | POA: Insufficient documentation

## 2019-11-09 DIAGNOSIS — Z113 Encounter for screening for infections with a predominantly sexual mode of transmission: Secondary | ICD-10-CM

## 2019-11-09 DIAGNOSIS — N898 Other specified noninflammatory disorders of vagina: Secondary | ICD-10-CM | POA: Diagnosis present

## 2019-11-09 DIAGNOSIS — J301 Allergic rhinitis due to pollen: Secondary | ICD-10-CM

## 2019-11-09 DIAGNOSIS — Z0000 Encounter for general adult medical examination without abnormal findings: Secondary | ICD-10-CM

## 2019-11-09 DIAGNOSIS — Z3041 Encounter for surveillance of contraceptive pills: Secondary | ICD-10-CM

## 2019-11-09 DIAGNOSIS — H02729 Madarosis of unspecified eye, unspecified eyelid and periocular area: Secondary | ICD-10-CM

## 2019-11-09 MED ORDER — DESLORATADINE 5 MG PO TABS: 5.0000 mg | ORAL_TABLET | Freq: Every day | ORAL | 11 refills | Status: DC

## 2019-11-09 MED ORDER — NORGESTIMATE-ETH ESTRADIOL 0.25-35 MG-MCG PO TABS: 1.0000 | ORAL_TABLET | Freq: Every day | ORAL | 4 refills | Status: DC

## 2019-11-09 MED ORDER — BIMATOPROST 0.03 % OP SOLN: 1.0000 [drp] | Freq: Every day | OPHTHALMIC | 4 refills | Status: DC

## 2019-11-09 MED ORDER — PRENATAL PLUS 27-1 MG PO TABS: 1.0000 | ORAL_TABLET | Freq: Every day | ORAL | 4 refills | Status: DC

## 2019-11-09 NOTE — Progress Notes (Signed)
Pt presents for annual, pap, and all STD testing.  ASCUS pap neg HPV 10/07/2018

## 2019-11-09 NOTE — Progress Notes (Signed)
Subjective:        Adrienne [name] is a 34 y.o. female here for a routine exam.  Current complaints: Vaginal discharge.    Personal health questionnaire:  Is patient Ashkenazi Jewish, have a family history of breast and/or ovarian cancer: no Is there a family history of uterine cancer diagnosed at age < 70, gastrointestinal cancer, urinary tract cancer, family member who is a Personnel officer syndrome-associated carrier: no Is the patient overweight and hypertensive, family history of diabetes, personal history of gestational diabetes, preeclampsia or PCOS: no Is patient over 16, have PCOS,  family history of premature CHD under age 41, diabetes, smoke, have hypertension or peripheral artery disease:  no At any time, has a partner hit, kicked or otherwise hurt or frightened you?: no Over the past 2 weeks, have you felt down, depressed or hopeless?: no Over the past 2 weeks, have you felt little interest or pleasure in doing things?:no   Gynecologic History Patient's last menstrual period was 09/26/2019. Contraception: OCP (estrogen/progesterone) Last Pap: 10-07-2018. Results were: normal Last mammogram: n/a. Results were: n/a  Obstetric History OB History  Gravida Para Term Preterm AB Living  1 1 1  0 0 1  SAB TAB Ectopic Multiple Live Births  0 0 0 0 1    # Outcome Date GA Lbr Len/2nd Weight Sex Delivery Anes PTL Lv  1 Term 05/01/07 [redacted]w[redacted]d  8 lb 9 oz (3.884 kg) M Vag-Spont None  LIV    Past Medical History:  Diagnosis Date   Allergy     History reviewed. No pertinent surgical history.   Current Outpatient Medications:    bimatoprost (LUMIGAN) 0.03 % ophthalmic solution, Place 1 drop into both eyes at bedtime., Disp: 5 mL, Rfl: 4   norgestimate-ethinyl estradiol (SPRINTEC 28) 0.25-35 MG-MCG tablet, Take 1 tablet by mouth daily., Disp: 84 tablet, Rfl: 4   prenatal vitamin w/FE, FA (PRENATAL 1 + 1) 27-1 MG TABS tablet, Take 1 tablet by mouth daily before breakfast., Disp: 90  tablet, Rfl: 4   valACYclovir (VALTREX) 1000 MG tablet, Take 1 tablet twice a day for 10 days, then take 1 tablet daily for suppression., Disp: 30 tablet, Rfl: 12   amoxicillin-clavulanate (AUGMENTIN) 875-125 MG tablet, Take 1 tablet by mouth 2 (two) times daily. (Patient not taking: Reported on 09/30/2018), Disp: 14 tablet, Rfl: 0   desloratadine (CLARINEX) 5 MG tablet, Take 1 tablet (5 mg total) by mouth daily., Disp: 30 tablet, Rfl: 11   nitrofurantoin, macrocrystal-monohydrate, (MACROBID) 100 MG capsule, Take 1 capsule (100 mg total) by mouth 2 (two) times daily. (Patient not taking: Reported on 09/30/2018), Disp: 14 capsule, Rfl: 0   sulfamethoxazole-trimethoprim (BACTRIM DS) 800-160 MG tablet, Take 1 tablet by mouth 2 (two) times daily. (Patient not taking: Reported on 09/30/2018), Disp: 14 tablet, Rfl: 0   tinidazole (TINDAMAX) 500 MG tablet, Take 2 tablets (1,000 mg total) by mouth daily with breakfast. (Patient not taking: Reported on 11/09/2019), Disp: 10 tablet, Rfl: 2 No Known Allergies  Social History   Tobacco Use   Smoking status: Former Smoker    Quit date: 09/08/2007    Years since quitting: 12.1   Smokeless tobacco: Never Used  Substance Use Topics   Alcohol use: Yes    Alcohol/week: 0.0 standard drinks    Comment: occasionally     Family History  Problem Relation Age of Onset   Hypertension Mother       Review of Systems  Constitutional: negative for fatigue and weight  loss Respiratory: negative for cough and wheezing Cardiovascular: negative for chest pain, fatigue and palpitations Gastrointestinal: negative for abdominal pain and change in bowel habits Musculoskeletal:negative for myalgias Neurological: negative for gait problems and tremors Behavioral/Psych: negative for abusive relationship, depression Endocrine: negative for temperature intolerance    Genitourinary:negative for abnormal menstrual periods, genital lesions, hot flashes, sexual problems.   Positive for vaginal discharge Integument/breast: negative for breast lump, breast tenderness, nipple discharge and skin lesion(s)    Objective:       BP 121/86    Pulse 72    Ht 5' 5.5" (1.664 m)    Wt 153 lb 8 oz (69.6 kg)    LMP 09/26/2019    BMI 25.16 kg/m  General:   alert and no distress  Skin:   no rash or abnormalities  Lungs:   clear to auscultation bilaterally  Heart:   regular rate and rhythm, S1, S2 normal, no murmur, click, rub or gallop  Breasts:   normal without suspicious masses, skin or nipple changes or axillary nodes  Abdomen:  normal findings: no organomegaly, soft, non-tender and no hernia  Pelvis:  External genitalia: normal general appearance Urinary system: urethral meatus normal and bladder without fullness, nontender Vaginal: normal without tenderness, induration or masses Cervix: normal appearance Adnexa: normal bimanual exam Uterus: anteverted and non-tender, normal size   Lab Review Urine pregnancy test Labs reviewed yes Radiologic studies reviewed no  50% of 20 min visit spent on counseling and coordination of care.   Assessment:    Healthy female exam.    Plan:    Education reviewed: calcium supplements, depression evaluation, low fat, low cholesterol diet, safe sex/STD prevention, self breast exams and weight bearing exercise. Contraception: OCP (estrogen/progesterone). Follow up in: 1 year.   Meds ordered this encounter  Medications   norgestimate-ethinyl estradiol (SPRINTEC 28) 0.25-35 MG-MCG tablet    Sig: Take 1 tablet by mouth daily.    Dispense:  84 tablet    Refill:  4   bimatoprost (LUMIGAN) 0.03 % ophthalmic solution    Sig: Place 1 drop into both eyes at bedtime.    Dispense:  5 mL    Refill:  4   prenatal vitamin w/FE, FA (PRENATAL 1 + 1) 27-1 MG TABS tablet    Sig: Take 1 tablet by mouth daily before breakfast.    Dispense:  90 tablet    Refill:  4   desloratadine (CLARINEX) 5 MG tablet    Sig: Take 1 tablet (5 mg  total) by mouth daily.    Dispense:  30 tablet    Refill:  11   Orders Placed This Encounter  Procedures   Hepatitis B surface antigen   Hepatitis C antibody   HIV Antibody (routine testing w rflx)   RPR    Brock Bad, MD 11/09/2019 4:18 PM

## 2019-11-10 LAB — HEPATITIS B SURFACE ANTIGEN: Hepatitis B Surface Ag: NEGATIVE

## 2019-11-10 LAB — HEPATITIS C ANTIBODY: Hep C Virus Ab: <0.1 s/co ratio (ref 0.0–0.9)

## 2019-11-10 LAB — RPR: RPR Ser Ql: NONREACTIVE

## 2019-11-10 LAB — HIV ANTIBODY (ROUTINE TESTING W REFLEX): HIV Screen 4th Generation wRfx: NONREACTIVE

## 2019-11-13 LAB — CERVICOVAGINAL ANCILLARY ONLY
Bacterial Vaginitis (gardnerella): NEGATIVE
Candida Glabrata: NEGATIVE
Candida Vaginitis: NEGATIVE
Chlamydia: NEGATIVE
Comment: NEGATIVE
Comment: NEGATIVE
Comment: NEGATIVE
Comment: NEGATIVE
Comment: NEGATIVE
Comment: NORMAL
Neisseria Gonorrhea: NEGATIVE
Trichomonas: NEGATIVE

## 2019-11-13 LAB — CYTOLOGY - PAP
Comment: NEGATIVE
Diagnosis: NEGATIVE
Diagnosis: REACTIVE
High risk HPV: NEGATIVE

## 2020-02-28 ENCOUNTER — Other Ambulatory Visit: Payer: Self-pay | Admitting: Obstetrics

## 2020-02-28 DIAGNOSIS — H02729 Madarosis of unspecified eye, unspecified eyelid and periocular area: Secondary | ICD-10-CM

## 2020-02-29 ENCOUNTER — Other Ambulatory Visit: Payer: Self-pay | Admitting: Obstetrics

## 2020-02-29 DIAGNOSIS — H02729 Madarosis of unspecified eye, unspecified eyelid and periocular area: Secondary | ICD-10-CM

## 2020-11-27 ENCOUNTER — Other Ambulatory Visit: Payer: Self-pay | Admitting: Obstetrics

## 2020-11-27 DIAGNOSIS — Z3041 Encounter for surveillance of contraceptive pills: Secondary | ICD-10-CM

## 2020-12-03 ENCOUNTER — Other Ambulatory Visit: Payer: Self-pay

## 2020-12-03 ENCOUNTER — Encounter: Payer: Self-pay | Admitting: Obstetrics

## 2020-12-03 ENCOUNTER — Other Ambulatory Visit (HOSPITAL_COMMUNITY)
Admission: RE | Admit: 2020-12-03 | Discharge: 2020-12-03 | Disposition: A | Discharge status: Home / Self Care | Payer: 59 | Source: Ambulatory Visit | Attending: Obstetrics | Admitting: Obstetrics

## 2020-12-03 ENCOUNTER — Ambulatory Visit (INDEPENDENT_AMBULATORY_CARE_PROVIDER_SITE_OTHER): Payer: 59 | Admitting: Obstetrics

## 2020-12-03 VITALS — BP 124/79 | HR 76 | Ht 65.0 in | Wt 163.0 lb

## 2020-12-03 DIAGNOSIS — Z01419 Encounter for gynecological examination (general) (routine) without abnormal findings: Secondary | ICD-10-CM | POA: Insufficient documentation

## 2020-12-03 DIAGNOSIS — N898 Other specified noninflammatory disorders of vagina: Secondary | ICD-10-CM

## 2020-12-03 DIAGNOSIS — Z0000 Encounter for general adult medical examination without abnormal findings: Secondary | ICD-10-CM

## 2020-12-03 DIAGNOSIS — Z113 Encounter for screening for infections with a predominantly sexual mode of transmission: Secondary | ICD-10-CM

## 2020-12-03 DIAGNOSIS — A6004 Herpesviral vulvovaginitis: Secondary | ICD-10-CM | POA: Diagnosis not present

## 2020-12-03 DIAGNOSIS — Z3041 Encounter for surveillance of contraceptive pills: Secondary | ICD-10-CM

## 2020-12-03 DIAGNOSIS — J301 Allergic rhinitis due to pollen: Secondary | ICD-10-CM

## 2020-12-03 DIAGNOSIS — H02729 Madarosis of unspecified eye, unspecified eyelid and periocular area: Secondary | ICD-10-CM

## 2020-12-03 DIAGNOSIS — N9412 Deep dyspareunia: Secondary | ICD-10-CM

## 2020-12-03 MED ORDER — PRENATAL PLUS 27-1 MG PO TABS: 1.0000 | ORAL_TABLET | Freq: Every day | ORAL | 4 refills | Status: AC

## 2020-12-03 MED ORDER — VALACYCLOVIR HCL 1 G PO TABS
ORAL_TABLET | ORAL | 12 refills | Status: DC
  Filled 2020-12-06: qty 30, 20d supply, fill #0
  Filled 2021-06-05: qty 30, 20d supply, fill #1
  Filled 2021-10-28 – 2021-11-05 (×2): qty 30, 20d supply, fill #2

## 2020-12-03 MED ORDER — BIMATOPROST 0.03 % EX SOLN
1.0000 [drp] | Freq: Every day | CUTANEOUS | 12 refills | Status: DC
Start: 2020-12-03 — End: 2023-07-22

## 2020-12-03 MED ORDER — DESLORATADINE 5 MG PO TABS: 5.0000 mg | ORAL_TABLET | Freq: Every day | ORAL | 11 refills | Status: DC

## 2020-12-03 MED ORDER — NORGESTIMATE-ETH ESTRADIOL 0.25-35 MG-MCG PO TABS: 1.0000 | ORAL_TABLET | Freq: Every day | ORAL | 11 refills | Status: DC

## 2020-12-03 NOTE — Progress Notes (Signed)
Subjective:        Adrienne [name] is a 35 y.o. female here for a routine exam.  Current complaints: Vaginal discharge.    Personal health questionnaire:  Is patient Ashkenazi Jewish, have a family history of breast and/or ovarian cancer: no Is there a family history of uterine cancer diagnosed at age < 72, gastrointestinal cancer, urinary tract cancer, family member who is a Personnel officer syndrome-associated carrier: no Is the patient overweight and hypertensive, family history of diabetes, personal history of gestational diabetes, preeclampsia or PCOS: no Is patient over 60, have PCOS,  family history of premature CHD under age 21, diabetes, smoke, have hypertension or peripheral artery disease:  no At any time, has a partner hit, kicked or otherwise hurt or frightened you?: no Over the past 2 weeks, have you felt down, depressed or hopeless?: no Over the past 2 weeks, have you felt little interest or pleasure in doing things?:no   Gynecologic History Patient's last menstrual period was 10/20/2020. Contraception: OCP (estrogen/progesterone) Last Pap: 11-09-2019. Results were: normal Last mammogram: n/a. Results were: n/a  Obstetric History OB History  Gravida Para Term Preterm AB Living  1 1 1  0 0 1  SAB IAB Ectopic Multiple Live Births  0 0 0 0 1    # Outcome Date GA Lbr Len/2nd Weight Sex Delivery Anes PTL Lv  1 Term 05/01/07 [redacted]w[redacted]d  8 lb 9 oz (3.884 kg) M Vag-Spont None  LIV    Past Medical History:  Diagnosis Date   Allergy     History reviewed. No pertinent surgical history.   Current Outpatient Medications:    bimatoprost (LATISSE) 0.03 % ophthalmic solution, Place 1 drop into both eyes at bedtime., Disp: 3 mL, Rfl: 12   desloratadine (CLARINEX) 5 MG tablet, Take 1 tablet (5 mg total) by mouth daily., Disp: 30 tablet, Rfl: 11   norgestimate-ethinyl estradiol (SPRINTEC 28) 0.25-35 MG-MCG tablet, Take 1 tablet by mouth daily., Disp: 28 tablet, Rfl: 11   prenatal vitamin  w/FE, FA (PRENATAL 1 + 1) 27-1 MG TABS tablet, Take 1 tablet by mouth daily before breakfast., Disp: 90 tablet, Rfl: 4   valACYclovir (VALTREX) 1000 MG tablet, Take 1 tablet twice a day for 10 days, then take 1 tablet daily for suppression., Disp: 30 tablet, Rfl: 12 No Known Allergies  Social History   Tobacco Use   Smoking status: Former    Types: Cigarettes    Quit date: 09/08/2007    Years since quitting: 13.2   Smokeless tobacco: Never  Substance Use Topics   Alcohol use: Yes    Alcohol/week: 0.0 standard drinks    Comment: occasionally     Family History  Problem Relation Age of Onset   Hypertension Mother       Review of Systems  Constitutional: negative for fatigue and weight loss Respiratory: negative for cough and wheezing Cardiovascular: negative for chest pain, fatigue and palpitations Gastrointestinal: negative for abdominal pain and change in bowel habits Musculoskeletal:negative for myalgias Neurological: negative for gait problems and tremors Behavioral/Psych: negative for abusive relationship, depression Endocrine: negative for temperature intolerance    Genitourinary:positive for vaginal discharge.  negative for abnormal menstrual periods, genital lesions, hot flashes, sexual problems  Integument/breast: negative for breast lump, breast tenderness, nipple discharge and skin lesion(s)    Objective:       BP 124/79   Pulse 76   Ht 5\' 5"  (1.651 m)   Wt 163 lb (73.9 kg)   LMP 10/20/2020  Comment: irregular cycles  BMI 27.12 kg/m  General:   Alert and no distress  Skin:   no rash or abnormalities  Lungs:   clear to auscultation bilaterally  Heart:   regular rate and rhythm, S1, S2 normal, no murmur, click, rub or gallop  Breasts:   normal without suspicious masses, skin or nipple changes or axillary nodes  Abdomen:  normal findings: no organomegaly, soft, non-tender and no hernia  Pelvis:  External genitalia: normal general appearance Urinary system:  urethral meatus normal and bladder without fullness, nontender Vaginal: normal without tenderness, induration or masses Cervix: normal appearance Adnexa: normal bimanual exam Uterus: anteverted and non-tender, normal size   Lab Review Urine pregnancy test Labs reviewed yes Radiologic studies reviewed no  I have spent a total of 20 minutes of face-to-face time, excluding clinical staff time, reviewing notes and preparing to see patient, ordering tests and/or medications, and counseling the patient.   Assessment:   1. Encounter for gynecological examination with Papanicolaou smear of cervix Rx: - Cytology - PAP( [place])  2. Vaginal discharge Rx: - Cervicovaginal ancillary only( [place])  3. Screening for STD (sexually transmitted disease) Rx: - HIV Antibody (routine testing w rflx) - Hepatitis B surface antigen - RPR - Hepatitis C antibody  4. Deep dyspareunia Rx: - US PELVIC COMPLETE WITH TRANSVAGINAL; Future  5. Encounter for surveillance of contraceptive pills Rx: - norgestimate-ethinyl estradiol (SPRINTEC 28) 0.25-35 MG-MCG tablet; Take 1 tablet by mouth daily.  Dispense: 28 tablet; Refill: 11  6. Hypotrichosis of eyelid, unspecified laterality Rx: - bimatoprost (LATISSE) 0.03 % ophthalmic solution; Place 1 drop into both eyes at bedtime.  Dispense: 3 mL; Refill: 12  7. Herpes simplex vulvovaginitis Rx: - valACYclovir (VALTREX) 1000 MG tablet; Take 1 tablet twice a day for 10 days, then take 1 tablet daily for suppression.  Dispense: 30 tablet; Refill: 12  8. Seasonal allergic rhinitis due to pollen Rx: - desloratadine (CLARINEX) 5 MG tablet; Take 1 tablet (5 mg total) by mouth daily.  Dispense: 30 tablet; Refill: 11  9. Routine adult health maintenance Rx: - prenatal vitamin w/FE, FA (PRENATAL 1 + 1) 27-1 MG TABS tablet; Take 1 tablet by mouth daily before breakfast.  Dispense: 90 tablet; Refill: 4      Plan:    Education reviewed: calcium  supplements, depression evaluation, low fat, low cholesterol diet, safe sex/STD prevention, self breast exams, and weight bearing exercise. Contraception: OCP (estrogen/progesterone). Follow up in: 1 year.   Meds ordered this encounter  Medications   norgestimate-ethinyl estradiol (SPRINTEC 28) 0.25-35 MG-MCG tablet    Sig: Take 1 tablet by mouth daily.    Dispense:  28 tablet    Refill:  11   bimatoprost (LATISSE) 0.03 % ophthalmic solution    Sig: Place 1 drop into both eyes at bedtime.    Dispense:  3 mL    Refill:  12   desloratadine (CLARINEX) 5 MG tablet    Sig: Take 1 tablet (5 mg total) by mouth daily.    Dispense:  30 tablet    Refill:  11   valACYclovir (VALTREX) 1000 MG tablet    Sig: Take 1 tablet twice a day for 10 days, then take 1 tablet daily for suppression.    Dispense:  30 tablet    Refill:  12   prenatal vitamin w/FE, FA (PRENATAL 1 + 1) 27-1 MG TABS tablet    Sig: Take 1 tablet by mouth daily before breakfast.  Dispense:  90 tablet    Refill:  4   Orders Placed This Encounter  Procedures   US PELVIC COMPLETE WITH TRANSVAGINAL    Standing Status:   Future    Standing Expiration Date:   12/03/2021    Order Specific Question:   Reason for Exam (SYMPTOM  OR DIAGNOSIS REQUIRED)    Answer:   Pain with intercourse.    Order Specific Question:   Preferred imaging location?    Answer:   [facility]   HIV Antibody (routine testing w rflx)   Hepatitis B surface antigen   RPR   Hepatitis C antibody      Brock Bad, MD 12/03/2020 4:35 PM

## 2020-12-03 NOTE — Progress Notes (Signed)
Pt states she is having some pain with intercourse. Pt needs refills on Rx's.

## 2020-12-04 LAB — HIV ANTIBODY (ROUTINE TESTING W REFLEX): HIV Screen 4th Generation wRfx: NONREACTIVE

## 2020-12-04 LAB — HEPATITIS B SURFACE ANTIGEN: Hepatitis B Surface Ag: NEGATIVE

## 2020-12-04 LAB — RPR: RPR Ser Ql: NONREACTIVE

## 2020-12-04 LAB — HEPATITIS C ANTIBODY: Hep C Virus Ab: <0.1 s/co ratio (ref 0.0–0.9)

## 2020-12-05 LAB — CERVICOVAGINAL ANCILLARY ONLY
Bacterial Vaginitis (gardnerella): POSITIVE — AB
Candida Glabrata: NEGATIVE
Candida Vaginitis: NEGATIVE
Chlamydia: NEGATIVE
Comment: NEGATIVE
Comment: NEGATIVE
Comment: NEGATIVE
Comment: NEGATIVE
Comment: NEGATIVE
Comment: NORMAL
Neisseria Gonorrhea: NEGATIVE
Trichomonas: NEGATIVE

## 2020-12-06 ENCOUNTER — Other Ambulatory Visit (HOSPITAL_COMMUNITY): Payer: Self-pay

## 2020-12-06 ENCOUNTER — Other Ambulatory Visit: Payer: Self-pay | Admitting: Obstetrics

## 2020-12-06 DIAGNOSIS — B9689 Other specified bacterial agents as the cause of diseases classified elsewhere: Secondary | ICD-10-CM

## 2020-12-06 MED ORDER — METRONIDAZOLE 500 MG PO TABS: 500.0000 mg | ORAL_TABLET | Freq: Two times a day (BID) | ORAL | 2 refills | Status: DC

## 2020-12-10 LAB — CYTOLOGY - PAP
Comment: NEGATIVE
Diagnosis: UNDETERMINED — AB
High risk HPV: NEGATIVE

## 2020-12-27 ENCOUNTER — Other Ambulatory Visit (HOSPITAL_COMMUNITY): Payer: Self-pay

## 2021-06-05 ENCOUNTER — Other Ambulatory Visit (HOSPITAL_COMMUNITY): Payer: Self-pay

## 2021-10-28 ENCOUNTER — Other Ambulatory Visit (HOSPITAL_COMMUNITY): Payer: Self-pay

## 2021-11-04 ENCOUNTER — Other Ambulatory Visit: Payer: Self-pay | Admitting: Obstetrics

## 2021-11-04 DIAGNOSIS — Z3041 Encounter for surveillance of contraceptive pills: Secondary | ICD-10-CM

## 2021-11-05 ENCOUNTER — Encounter: Payer: Self-pay | Admitting: Emergency Medicine

## 2021-11-05 ENCOUNTER — Other Ambulatory Visit: Payer: Self-pay | Admitting: Emergency Medicine

## 2021-11-05 ENCOUNTER — Other Ambulatory Visit (HOSPITAL_COMMUNITY): Payer: Self-pay

## 2021-11-05 DIAGNOSIS — Z3041 Encounter for surveillance of contraceptive pills: Secondary | ICD-10-CM

## 2021-11-05 MED ORDER — NORGESTIMATE-ETH ESTRADIOL 0.25-35 MG-MCG PO TABS: 1.0000 | ORAL_TABLET | Freq: Every day | ORAL | 1 refills | Status: DC

## 2021-11-05 NOTE — Progress Notes (Signed)
OCP refilled to cover until annual visit on 12/04/21.

## 2021-12-04 ENCOUNTER — Encounter: Payer: Self-pay | Admitting: Obstetrics and Gynecology

## 2021-12-04 ENCOUNTER — Other Ambulatory Visit (HOSPITAL_COMMUNITY)
Admission: RE | Admit: 2021-12-04 | Discharge: 2021-12-04 | Disposition: A | Discharge status: Home / Self Care | Payer: 59 | Source: Ambulatory Visit | Attending: Obstetrics and Gynecology | Admitting: Obstetrics and Gynecology

## 2021-12-04 ENCOUNTER — Ambulatory Visit (INDEPENDENT_AMBULATORY_CARE_PROVIDER_SITE_OTHER): Payer: 59 | Admitting: Obstetrics and Gynecology

## 2021-12-04 VITALS — BP 125/87 | HR 64 | Ht 65.5 in | Wt 150.0 lb

## 2021-12-04 DIAGNOSIS — Z01419 Encounter for gynecological examination (general) (routine) without abnormal findings: Secondary | ICD-10-CM | POA: Diagnosis not present

## 2021-12-04 DIAGNOSIS — Z3041 Encounter for surveillance of contraceptive pills: Secondary | ICD-10-CM | POA: Diagnosis not present

## 2021-12-04 MED ORDER — NORETHIN ACE-ETH ESTRAD-FE 1-20 MG-MCG(24) PO TABS: 1.0000 | ORAL_TABLET | Freq: Every day | ORAL | 11 refills | Status: DC

## 2021-12-04 NOTE — Progress Notes (Signed)
Subjective:     Adrienne [name] is a 36 y.o. female P1 with LMP 11/30/21 and BMI 24 who is here for a comprehensive physical exam. The patient reports no problems. She is sexually active using COC for contraception. She denies pelvic pain or abnormal discharge. She denies urinary symptoms or constipation. Patient reports some irregular bleeding with Sprintec and desires a change in [place]  Past Medical History:  Diagnosis Date   Allergy    History reviewed. No pertinent surgical history. Family History  Problem Relation Age of Onset   Hypertension Mother      Social History   Socioeconomic History   Marital status: Single    Spouse name: Not on file   Number of children: Not on file   Years of education: Not on file   Highest education level: Not on file  Occupational History   Not on file  Tobacco Use   Smoking status: Former    Types: Cigarettes    Quit date: 09/08/2007    Years since quitting: 14.2   Smokeless tobacco: Never  Substance and Sexual Activity   Alcohol use: Yes    Alcohol/week: 0.0 standard drinks of alcohol    Comment: occasionally    Drug use: No   Sexual activity: Yes    Partners: Male    Birth control/protection: Pill  Other Topics Concern   Not on file  Social History Narrative   Not on file   Social Determinants of Health   Financial Resource Strain: Not on file  Food Insecurity: Not on file  Transportation Needs: Not on file  Physical Activity: Not on file  Stress: Not on file  Social Connections: Not on file  Intimate Partner Violence: Not on file   Health Maintenance  Topic Date Due   TETANUS/TDAP  Never done   COVID-19 Vaccine (3 - Pfizer series) 10/12/2019   INFLUENZA VACCINE  Never done   PAP SMEAR-Modifier  12/04/2023   Hepatitis C Screening  Completed   HIV Screening  Completed   HPV VACCINES  Aged Out       Review of Systems Pertinent items noted in HPI and remainder of comprehensive ROS otherwise negative.   Objective:   Blood pressure 125/87, pulse 64, height 5' 5.5" (1.664 m), weight 150 lb (68 kg), last menstrual period 11/30/2021.   GENERAL: Well-developed, well-nourished female in no acute distress.  HEENT: Normocephalic, atraumatic. Sclerae anicteric.  NECK: Supple. Normal thyroid.  LUNGS: Clear to auscultation bilaterally.  HEART: Regular rate and rhythm. BREASTS: Symmetric in size. No palpable masses or lymphadenopathy, skin changes, or nipple drainage. ABDOMEN: Soft, nontender, nondistended. No organomegaly. PELVIC: Normal external female genitalia. Vagina is pink and rugated.  Normal discharge. Normal appearing cervix. Uterus is normal in size. No adnexal mass or tenderness. Chaperone present during the pelvic exam EXTREMITIES: No cyanosis, clubbing, or edema, 2+ distal pulses.     Assessment:    Healthy female exam.      Plan:    Pap smear collected Patient declined STI testing Rx loestrin provided Patient will be contacted with abnormal results See After Visit Summary for Counseling Recommendations

## 2021-12-04 NOTE — Progress Notes (Signed)
Pt would like to discuss other BC options.  Pt is on Sprinctec but has been having irregular cycles this year.

## 2021-12-10 LAB — CYTOLOGY - PAP
Adequacy: ABSENT
Diagnosis: NEGATIVE

## 2021-12-19 ENCOUNTER — Other Ambulatory Visit (HOSPITAL_BASED_OUTPATIENT_CLINIC_OR_DEPARTMENT_OTHER): Payer: Self-pay

## 2021-12-19 MED ORDER — INFLUENZA VAC SPLIT QUAD 0.5 ML IM SUSY
PREFILLED_SYRINGE | INTRAMUSCULAR | 0 refills | Status: DC
  Filled 2021-12-19: qty 0.5, 1d supply, fill #0

## 2022-04-15 ENCOUNTER — Other Ambulatory Visit: Payer: Self-pay | Admitting: Obstetrics

## 2022-04-15 ENCOUNTER — Other Ambulatory Visit (HOSPITAL_COMMUNITY): Payer: Self-pay

## 2022-04-15 DIAGNOSIS — A6004 Herpesviral vulvovaginitis: Secondary | ICD-10-CM

## 2022-04-16 ENCOUNTER — Other Ambulatory Visit (HOSPITAL_COMMUNITY): Payer: Self-pay

## 2022-04-16 MED ORDER — VALACYCLOVIR HCL 1 G PO TABS
ORAL_TABLET | ORAL | 12 refills | Status: AC
  Filled 2022-04-16: qty 30, 20d supply, fill #0
  Filled 2022-08-29: qty 30, 20d supply, fill #1
  Filled 2022-11-23 – 2023-03-31 (×2): qty 30, 20d supply, fill #2

## 2022-08-31 ENCOUNTER — Other Ambulatory Visit (HOSPITAL_COMMUNITY): Payer: Self-pay

## 2022-09-02 ENCOUNTER — Telehealth: Payer: Self-pay | Admitting: Family Medicine

## 2022-09-02 NOTE — Telephone Encounter (Signed)
Pt works for [employer] and would like to become a Pt of Dr. [place].  Pt is on a deadline and must have her CPE by the end of August.  Pt would like to know if MD can also give her a CPE on the day of her New Patient Establishment visit on 10/23/22?  Please advise.

## 2022-09-02 NOTE — Telephone Encounter (Signed)
Pt was informed    Thank you

## 2022-09-02 NOTE — Telephone Encounter (Signed)
Shouldn't be a problem as long as she doesn't have any other issues to discuss.

## 2022-09-23 NOTE — Progress Notes (Unsigned)
HPI: [name] is a 37 y.o. female, who is here today to establish care.  Former PCP: N/A Last preventive female care with her gynecologist or 12/04/2021. She has no concerns today, would like a CPE.  She has been seeing her gynecologist consistently. She denies any history of depression or anxiety but mentions occasional moments of anxiety and spells of feeling down, though not frequent.  She states that she follows a generally healthful diet, cooking at home and using natural herbs, and avoids fast food and eating out. She snacks on granola bars and cereal, and does not consume many sweets, cakes, or cookies. She exercises regularly, attending the gym at least three times a week, engaging in cardio, core workouts, and some weightlifting. She has a history of smoking 15 years ago for approximately 1.5 to 2 years but denies any current tobacco use or illicit drug use.  She reports taking OCP daily, Valtrex as needed, seasonal allergy medication, and a prenatal multivitamin. Valtrex and OCP's prescribed by her gynecologist.  She visits the eye care provider annually, with her last visit in March.  She also has regular dental visits.  Immunization History  Administered Date(s) Administered   PFIZER(Purple Top)SARS-COV-2 Vaccination 07/21/2019, 08/17/2019   Review of Systems  Constitutional:  Negative for activity change, appetite change and fever.  HENT:  Negative for hearing loss, mouth sores, sore throat and trouble swallowing.   Eyes:  Negative for redness and visual disturbance.  Respiratory:  Negative for cough, shortness of breath and wheezing.   Cardiovascular:  Negative for chest pain and leg swelling.  Gastrointestinal:  Negative for abdominal pain, nausea and vomiting.       No changes in bowel habits.  Endocrine: Negative for cold intolerance, heat intolerance, polydipsia, polyphagia and polyuria.  Genitourinary:  Negative for decreased urine volume, dysuria and  hematuria.  Musculoskeletal:  Negative for gait problem and myalgias.  Skin:  Negative for color change and rash.  Allergic/Immunologic: Positive for environmental allergies.  Neurological:  Negative for syncope, weakness and headaches.  Hematological:  Negative for adenopathy. Does not bruise/bleed easily.  Psychiatric/Behavioral:  Negative for confusion and hallucinations.   All other systems reviewed and are negative.  Current Outpatient Medications on File Prior to Visit  Medication Sig Dispense Refill   bimatoprost (LATISSE) 0.03 % ophthalmic solution Place 1 drop into both eyes at bedtime. 3 mL 12   desloratadine (CLARINEX) 5 MG tablet Take 1 tablet (5 mg total) by mouth daily. 30 tablet 11   Norethindrone Acetate-Ethinyl Estrad-FE (LOESTRIN 24 FE) 1-20 MG-MCG(24) tablet Take 1 tablet by mouth daily. 28 tablet 11   prenatal vitamin w/FE, FA (PRENATAL 1 + 1) 27-1 MG TABS tablet Take 1 tablet by mouth daily before breakfast. 90 tablet 4   valACYclovir (VALTREX) 1000 MG tablet Take 1 tablet twice a day for 10 days, then take 1 tablet daily for suppression. 30 tablet 12   No current facility-administered medications on file prior to visit.   Past Medical History:  Diagnosis Date   Allergy    No Known Allergies  Family History  Problem Relation Age of Onset   Hypertension Mother    Social History   Socioeconomic History   Marital status: Single    Spouse name: Not on file   Number of children: Not on file   Years of education: Not on file   Highest education level: Not on file  Occupational History   Not on file  Tobacco Use  Smoking status: Former    Current packs/day: 0.00    Types: Cigarettes    Quit date: 09/08/2007    Years since quitting: 15.0   Smokeless tobacco: Never  Vaping Use   Vaping status: Never Used  Substance and Sexual Activity   Alcohol use: Yes    Alcohol/week: 0.0 standard drinks of alcohol    Comment: occasionally    Drug use: No   Sexual  activity: Yes    Partners: Male    Birth control/protection: Pill  Other Topics Concern   Not on file  Social History Narrative   Not on file   Social Determinants of Health   Financial Resource Strain: Not on file  Food Insecurity: Not on file  Transportation Needs: Not on file  Physical Activity: Not on file  Stress: Not on file  Social Connections: Not on file   Vitals:   09/25/22 0931  BP: 120/70  Pulse: 82  Resp: 12  Temp: 98.9 F (37.2 C)  SpO2: 99%   Body mass index is 26.61 kg/m.  Physical Exam Vitals and nursing note reviewed.  Constitutional:      General: She is not in acute distress.    Appearance: She is well-developed.  HENT:     Head: Normocephalic and atraumatic.     Right Ear: Hearing, tympanic membrane, ear canal and external ear normal.     Left Ear: Hearing, tympanic membrane, ear canal and external ear normal.     Mouth/Throat:     Mouth: Mucous membranes are moist.     Pharynx: Oropharynx is clear. Uvula midline.  Eyes:     Extraocular Movements: Extraocular movements intact.     Conjunctiva/sclera: Conjunctivae normal.     Pupils: Pupils are equal, round, and reactive to light.  Neck:     Thyroid: No thyromegaly.     Trachea: No tracheal deviation.  Cardiovascular:     Rate and Rhythm: Normal rate and regular rhythm.     Pulses:          Dorsalis pedis pulses are 2+ on the right side and 2+ on the left side.     Heart sounds: No murmur heard. Pulmonary:     Effort: Pulmonary effort is normal. No respiratory distress.     Breath sounds: Normal breath sounds.  Abdominal:     Palpations: Abdomen is soft. There is no hepatomegaly or mass.     Tenderness: There is no abdominal tenderness.  Genitourinary:    Comments: Deferred to gyn. Musculoskeletal:     Comments: No major deformity or signs of synovitis appreciated.  Lymphadenopathy:     Cervical: No cervical adenopathy.     Upper Body:     Right upper body: No supraclavicular  adenopathy.     Left upper body: No supraclavicular adenopathy.  Skin:    General: Skin is warm.     Findings: No erythema or rash.  Neurological:     General: No focal deficit present.     Mental Status: She is alert and oriented to person, place, and time.     Cranial Nerves: No cranial nerve deficit.     Coordination: Coordination normal.     Gait: Gait normal.     Deep Tendon Reflexes:     Reflex Scores:      Bicep reflexes are 2+ on the right side and 2+ on the left side.      Patellar reflexes are 2+ on the right side and 2+ on  the left side. Psychiatric:        Mood and Affect: Mood and affect normal.   ASSESSMENT AND PLAN: Adrienne [name] was seen today to establish care and for annual exam.  Lab Results  Component Value Date   CHOL 261 (H) 09/25/2022   HDL 68.20 09/25/2022   LDLCALC 167 (H) 09/25/2022   TRIG 128.0 09/25/2022   CHOLHDL 4 09/25/2022   Lab Results  Component Value Date   NA 136 09/25/2022   CL 102 09/25/2022   K 3.9 09/25/2022   CO2 27 09/25/2022   BUN 12 09/25/2022   CREATININE 0.75 09/25/2022   GFR 103.12 09/25/2022   CALCIUM 9.4 09/25/2022   GLUCOSE 79 09/25/2022   Routine general medical examination at a health care facility Assessment & Plan: We discussed the importance of regular physical activity and healthy diet for prevention of chronic illness and/or complications. Preventive guidelines reviewed. Vaccination : She is not sure about last Tdap, will try to find out when it was. She will continue her female preventive care with her gynecologist. Next CPE in a year.   Overweight with body mass index (BMI) of 26 to 26.9 in adult -     Basic metabolic panel; Future -     Lipid panel; Future  Screening for endocrine, metabolic and immunity disorder -     Basic metabolic panel; Future  Hyperlipidemia, unspecified hyperlipidemia type Assessment & Plan: FLP ordered today abnormal. Will recommend non pharmacologic treatment and annual  follow up.  Orders: -     Lipid panel; Future  Return in 1 year (on 09/25/2023) for CPE.  Harlowe Dowler G. [place], MD  [facility]. [place] office.

## 2022-09-25 ENCOUNTER — Other Ambulatory Visit: Payer: Self-pay

## 2022-09-25 ENCOUNTER — Ambulatory Visit: Payer: Commercial Managed Care - PPO | Admitting: Family Medicine

## 2022-09-25 ENCOUNTER — Encounter: Payer: Self-pay | Admitting: Family Medicine

## 2022-09-25 VITALS — BP 120/70 | HR 82 | Temp 98.9°F | Resp 12 | Ht 65.5 in | Wt 162.4 lb

## 2022-09-25 DIAGNOSIS — Z0000 Encounter for general adult medical examination without abnormal findings: Secondary | ICD-10-CM | POA: Insufficient documentation

## 2022-09-25 DIAGNOSIS — Z1329 Encounter for screening for other suspected endocrine disorder: Secondary | ICD-10-CM | POA: Diagnosis not present

## 2022-09-25 DIAGNOSIS — Z13228 Encounter for screening for other metabolic disorders: Secondary | ICD-10-CM | POA: Diagnosis not present

## 2022-09-25 DIAGNOSIS — Z6826 Body mass index (BMI) 26.0-26.9, adult: Secondary | ICD-10-CM | POA: Diagnosis not present

## 2022-09-25 DIAGNOSIS — E785 Hyperlipidemia, unspecified: Secondary | ICD-10-CM

## 2022-09-25 DIAGNOSIS — E663 Overweight: Secondary | ICD-10-CM

## 2022-09-25 DIAGNOSIS — Z130 Encounter for screening for diseases of the blood and blood-forming organs and certain disorders involving the immune mechanism: Secondary | ICD-10-CM

## 2022-09-25 DIAGNOSIS — Z13220 Encounter for screening for lipoid disorders: Secondary | ICD-10-CM

## 2022-09-25 LAB — LIPID PANEL
Cholesterol: 261 mg/dL — ABNORMAL HIGH (ref 0–200)
HDL: 68.2 mg/dL (ref >39.00)
LDL Cholesterol: 167 mg/dL — ABNORMAL HIGH (ref 0–99)
NonHDL: 192.7
Total CHOL/HDL Ratio: 4
Triglycerides: 128 mg/dL (ref 0.0–149.0)
VLDL: 25.6 mg/dL (ref 0.0–40.0)

## 2022-09-25 LAB — BASIC METABOLIC PANEL
BUN: 12 mg/dL (ref 6–23)
CO2: 27 mEq/L (ref 19–32)
Calcium: 9.4 mg/dL (ref 8.4–10.5)
Chloride: 102 mEq/L (ref 96–112)
Creatinine, Ser: 0.75 mg/dL (ref 0.40–1.20)
GFR: 103.12 mL/min (ref >60.00)
Glucose, Bld: 79 mg/dL (ref 70–99)
Potassium: 3.9 mEq/L (ref 3.5–5.1)
Sodium: 136 mEq/L (ref 135–145)

## 2022-09-25 NOTE — Assessment & Plan Note (Signed)
We discussed the importance of regular physical activity and healthy diet for prevention of chronic illness and/or complications. Preventive guidelines reviewed. Vaccination : She is not sure about last Tdap, will try to find out when it was. She will continue her female preventive care with her gynecologist. Next CPE in a year.

## 2022-09-25 NOTE — Patient Instructions (Addendum)
A few things to remember from today's visit:  Routine general medical examination at a health care facility  Overweight with body mass index (BMI) of 26 to 26.9 in adult - Plan: Basic metabolic panel, Lipid panel  Screening for lipoid disorders - Plan: Lipid panel  Screening for endocrine, metabolic and immunity disorder - Plan: Basic metabolic panel  If you need refills for medications you take chronically, please call your pharmacy. Do not use My Chart to request refills or for acute issues that need immediate attention. If you send a my chart message, it may take a few days to be addressed, specially if I am not in the office.  Please be sure medication list is accurate. If a new problem present, please set up appointment sooner than planned today.  Health Maintenance, Female Adopting a healthy lifestyle and getting preventive care are important in promoting health and wellness. Ask your health care provider about: The right schedule for you to have regular tests and exams. Things you can do on your own to prevent diseases and keep yourself healthy. What should I know about diet, weight, and exercise? Eat a healthy diet  Eat a diet that includes plenty of vegetables, fruits, low-fat dairy products, and lean protein. Do not eat a lot of foods that are high in solid fats, added sugars, or sodium. Maintain a healthy weight Body mass index (BMI) is used to identify weight problems. It estimates body fat based on height and weight. Your health care provider can help determine your BMI and help you achieve or maintain a healthy weight. Get regular exercise Get regular exercise. This is one of the most important things you can do for your health. Most adults should: Exercise for at least 150 minutes each week. The exercise should increase your heart rate and make you sweat (moderate-intensity exercise). Do strengthening exercises at least twice a week. This is in addition to the  moderate-intensity exercise. Spend less time sitting. Even light physical activity can be beneficial. Watch cholesterol and blood lipids Have your blood tested for lipids and cholesterol at 35 years of age, then have this test every 5 years. Have your cholesterol levels checked more often if: Your lipid or cholesterol levels are high. You are older than 35 years of age. You are at high risk for heart disease. What should I know about cancer screening? Depending on your health history and family history, you may need to have cancer screening at various ages. This may include screening for: Breast cancer. Cervical cancer. Colorectal cancer. Skin cancer. Lung cancer. What should I know about heart disease, diabetes, and high blood pressure? Blood pressure and heart disease High blood pressure causes heart disease and increases the risk of stroke. This is more likely to develop in people who have high blood pressure readings or are overweight. Have your blood pressure checked: Every 3-5 years if you are 70-97 years of age. Every year if you are 54 years old or older. Diabetes Have regular diabetes screenings. This checks your fasting blood sugar level. Have the screening done: Once every three years after age 55 if you are at a normal weight and have a low risk for diabetes. More often and at a younger age if you are overweight or have a high risk for diabetes. What should I know about preventing infection? Hepatitis B If you have a higher risk for hepatitis B, you should be screened for this virus. Talk with your health care provider to find out  if you are at risk for hepatitis B infection. Hepatitis C Testing is recommended for: Everyone born from [birth year] through 1963. Anyone with known risk factors for hepatitis C. Sexually transmitted infections (STIs) Get screened for STIs, including gonorrhea and chlamydia, if: You are sexually active and are younger than 35 years of age. You are  older than 34 years of age and your health care provider tells you that you are at risk for this type of infection. Your sexual activity has changed since you were last screened, and you are at increased risk for chlamydia or gonorrhea. Ask your health care provider if you are at risk. Ask your health care provider about whether you are at high risk for HIV. Your health care provider may recommend a prescription medicine to help prevent HIV infection. If you choose to take medicine to prevent HIV, you should first get tested for HIV. You should then be tested every 3 months for as long as you are taking the medicine. Pregnancy If you are about to stop having your period (premenopausal) and you may become pregnant, seek counseling before you get pregnant. Take 400 to 800 micrograms (mcg) of folic acid every day if you become pregnant. Ask for birth control (contraception) if you want to prevent pregnancy. Osteoporosis and menopause Osteoporosis is a disease in which the bones lose minerals and strength with aging. This can result in bone fractures. If you are 65 years old or older, or if you are at risk for osteoporosis and fractures, ask your health care provider if you should: Be screened for bone loss. Take a calcium or vitamin D supplement to lower your risk of fractures. Be given hormone replacement therapy (HRT) to treat symptoms of menopause. Follow these instructions at home: Alcohol use Do not drink alcohol if: Your health care provider tells you not to drink. You are pregnant, may be pregnant, or are planning to become pregnant. If you drink alcohol: Limit how much you have to: 0-1 drink a day. Know how much alcohol is in your drink. In the [place] one drink equals one 12 oz bottle of beer (355 mL), one 5 oz glass of wine (148 mL), or one 1 oz glass of hard liquor (44 mL). Lifestyle Do not use any products that contain nicotine or tobacco. These products include cigarettes, chewing  tobacco, and vaping devices, such as e-cigarettes. If you need help quitting, ask your health care provider. Do not use street drugs. Do not share needles. Ask your health care provider for help if you need support or information about quitting drugs. General instructions Schedule regular health, dental, and eye exams. Stay current with your vaccines. Tell your health care provider if: You often feel depressed. You have ever been abused or do not feel safe at home. Summary Adopting a healthy lifestyle and getting preventive care are important in promoting health and wellness. Follow your health care provider's instructions about healthy diet, exercising, and getting tested or screened for diseases. Follow your health care provider's instructions on monitoring your cholesterol and blood pressure. This information is not intended to replace advice given to you by your health care provider. Make sure you discuss any questions you have with your health care provider. Document Revised: 07/01/2020 Document Reviewed: 07/01/2020 Elsevier Patient Education  2024 [employer].

## 2022-09-26 NOTE — Assessment & Plan Note (Signed)
FLP ordered today abnormal. Will recommend non pharmacologic treatment and annual follow up.

## 2022-10-23 ENCOUNTER — Ambulatory Visit: Payer: Commercial Managed Care - PPO | Admitting: Family Medicine

## 2022-11-09 ENCOUNTER — Other Ambulatory Visit: Payer: Self-pay | Admitting: Obstetrics and Gynecology

## 2022-11-12 ENCOUNTER — Other Ambulatory Visit: Payer: Self-pay

## 2022-11-12 DIAGNOSIS — Z3041 Encounter for surveillance of contraceptive pills: Secondary | ICD-10-CM

## 2022-11-12 MED ORDER — NORETHIN ACE-ETH ESTRAD-FE 1-20 MG-MCG(24) PO TABS: 1.0000 | ORAL_TABLET | Freq: Every day | ORAL | 1 refills | Status: DC

## 2022-12-03 ENCOUNTER — Other Ambulatory Visit (HOSPITAL_COMMUNITY): Payer: Self-pay

## 2022-12-15 ENCOUNTER — Encounter: Payer: Self-pay | Admitting: Advanced Practice Midwife

## 2022-12-15 ENCOUNTER — Ambulatory Visit: Payer: Commercial Managed Care - PPO | Admitting: Advanced Practice Midwife

## 2022-12-15 VITALS — BP 135/88 | HR 62 | Ht 65.0 in | Wt 164.4 lb

## 2022-12-15 DIAGNOSIS — Z01419 Encounter for gynecological examination (general) (routine) without abnormal findings: Secondary | ICD-10-CM

## 2022-12-15 DIAGNOSIS — Z3169 Encounter for other general counseling and advice on procreation: Secondary | ICD-10-CM

## 2022-12-15 NOTE — Progress Notes (Signed)
   Subjective:     Adrienne [name] is a 37 y.o. female here at [facility] for a routine exam.  Current complaints: none. Thinking about planning another pregnancy.  Questions about risks over age 54. Personal and family health history reviewed: yes.  Do you have a primary care provider? yes Do you feel safe at home? yes  Flowsheet Row Office Visit from 12/15/2022 in [facility] for [facility] at [facility] Total Score 2       Health Maintenance Due  Topic Date Due   DTaP/Tdap/Td (1 - Tdap) Never done   INFLUENZA VACCINE  Never done   COVID-19 Vaccine (3 - 2023-24 season) 10/25/2022     Risk factors for chronic health problems: Smoking: Alchohol/how much: Pt BMI: Body mass index is 27.36 kg/m.   Gynecologic History Patient's last menstrual period was 12/06/2022 (exact date). Contraception: OCP (estrogen/progesterone) Last Pap: 02/03/22. Results were: normal Last mammogram: n/a. Results were: /normal  Obstetric History OB History  Gravida Para Term Preterm AB Living  1 1 1  0 0 1  SAB IAB Ectopic Multiple Live Births  0 0 0 0 1    # Outcome Date GA Lbr Len/2nd Weight Sex Type Anes PTL Lv  1 Term 05/01/07 [redacted]w[redacted]d  8 lb 9 oz (3.884 kg) M Vag-Spont None  LIV     The following portions of the patient's history were reviewed and updated as appropriate: allergies, current medications, past family history, past medical history, past social history, past surgical history, and problem list.  Review of Systems Pertinent items noted in HPI and remainder of comprehensive ROS otherwise negative.    Objective:  BP 135/88   Pulse 62   Ht 5\' 5"  (1.651 m)   Wt 164 lb 6.4 oz (74.6 kg)   LMP 12/06/2022 (Exact Date)   BMI 27.36 kg/m   VS reviewed, nursing note reviewed,  Constitutional: well developed, well nourished, no distress HEENT: normocephalic, thyroid without enlargement or mass HEART: RRR, no murmurs rubs/gallops RESP: clear and equal to auscultation  bilaterally in all lobes  Breast Exam:   exam performed: right breast normal without mass, skin or nipple changes or axillary nodes, left breast normal without mass, skin or nipple changes or axillary nodes Abdomen: soft Neuro: alert and oriented x 3 Skin: warm, dry Psych: affect normal Pelvic exam: Bimanual exam: Cervix 0/long/high, firm, anterior, neg CMT, uterus nontender, nonenlarged, adnexa without tenderness, enlargement, or mass        Assessment/Plan:   1. Encounter for annual routine gynecological examination --Up to date on Pap, due 2026 --Reviewed Pap guidelines, science behind the recommendations, pt was doing Pap annually so wanted to discus the change.   Pt agrees with plan of care to repeat Pap per [facility] guidelines, prefers 3 years to 5 years.  --No gyn concerns or complaints, normal menses on low dose OCPs  2. Pre-conception counseling --Questions answered about increased risks of pregnancy over age 70.  While risks of genetic abnormalities for baby, and of diabetes and HTN etc for patient, absolute risk still remains low.   --Continue healthy habits, exercise, balanced diet --Folic acid/PNV prior to conceiving     Return in about 1 year (around 12/15/2023) for annual exam.   Sharen Counter, CNM 3:00 PM

## 2022-12-22 ENCOUNTER — Other Ambulatory Visit (HOSPITAL_BASED_OUTPATIENT_CLINIC_OR_DEPARTMENT_OTHER): Payer: Self-pay

## 2022-12-22 MED ORDER — FLULAVAL 0.5 ML IM SUSY
PREFILLED_SYRINGE | INTRAMUSCULAR | 0 refills | Status: DC
  Filled 2022-12-22: qty 0.5, 1d supply, fill #0

## 2022-12-30 ENCOUNTER — Other Ambulatory Visit: Payer: Self-pay | Admitting: Obstetrics and Gynecology

## 2022-12-30 DIAGNOSIS — Z3041 Encounter for surveillance of contraceptive pills: Secondary | ICD-10-CM

## 2023-01-28 ENCOUNTER — Other Ambulatory Visit: Payer: Self-pay | Admitting: Family Medicine

## 2023-01-28 DIAGNOSIS — Z3041 Encounter for surveillance of contraceptive pills: Secondary | ICD-10-CM

## 2023-02-02 ENCOUNTER — Other Ambulatory Visit: Payer: Self-pay | Admitting: *Deleted

## 2023-02-02 DIAGNOSIS — Z3041 Encounter for surveillance of contraceptive pills: Secondary | ICD-10-CM

## 2023-02-02 MED ORDER — LARIN 24 FE 1-20 MG-MCG(24) PO TABS: 1.0000 | ORAL_TABLET | Freq: Every day | ORAL | 10 refills | Status: DC

## 2023-02-02 NOTE — Progress Notes (Signed)
Refilled OCP until next annual due. OK per Dr. Debroah Loop.

## 2023-06-26 DIAGNOSIS — H5213 Myopia, bilateral: Secondary | ICD-10-CM | POA: Diagnosis not present

## 2023-07-22 ENCOUNTER — Ambulatory Visit: Admitting: Advanced Practice Midwife

## 2023-07-22 ENCOUNTER — Encounter: Payer: Self-pay | Admitting: Advanced Practice Midwife

## 2023-07-22 ENCOUNTER — Other Ambulatory Visit (HOSPITAL_COMMUNITY)
Admission: RE | Admit: 2023-07-22 | Discharge: 2023-07-22 | Disposition: A | Discharge status: Home / Self Care | Source: Ambulatory Visit | Attending: Advanced Practice Midwife | Admitting: Advanced Practice Midwife

## 2023-07-22 VITALS — BP 115/85 | HR 75 | Ht 65.0 in | Wt 177.8 lb

## 2023-07-22 DIAGNOSIS — Z113 Encounter for screening for infections with a predominantly sexual mode of transmission: Secondary | ICD-10-CM | POA: Diagnosis present

## 2023-07-22 DIAGNOSIS — Z0000 Encounter for general adult medical examination without abnormal findings: Secondary | ICD-10-CM | POA: Insufficient documentation

## 2023-07-22 DIAGNOSIS — N946 Dysmenorrhea, unspecified: Secondary | ICD-10-CM | POA: Diagnosis not present

## 2023-07-22 DIAGNOSIS — Z01419 Encounter for gynecological examination (general) (routine) without abnormal findings: Secondary | ICD-10-CM

## 2023-07-22 DIAGNOSIS — J301 Allergic rhinitis due to pollen: Secondary | ICD-10-CM

## 2023-07-22 DIAGNOSIS — H02729 Madarosis of unspecified eye, unspecified eyelid and periocular area: Secondary | ICD-10-CM

## 2023-07-22 MED ORDER — DESLORATADINE 5 MG PO TABS: 5.0000 mg | ORAL_TABLET | Freq: Every day | ORAL | 11 refills | Status: AC

## 2023-07-22 MED ORDER — CYCLOBENZAPRINE HCL 10 MG PO TABS: 10.0000 mg | ORAL_TABLET | Freq: Three times a day (TID) | ORAL | 1 refills | Status: AC | PRN

## 2023-07-22 MED ORDER — BIMATOPROST 0.03 % EX SOLN
1.0000 [drp] | Freq: Every day | CUTANEOUS | 12 refills | Status: AC
Start: 2023-07-22 — End: ?

## 2023-07-22 NOTE — Progress Notes (Signed)
 Asking about refills of latisse  and clarinex . Dr April Knack originally prescribed, but pts PCP wouldn't fill RX for her.   Pt concerned of pelvic pain and discomfort that she states felt like contractions (scale of 8) with her last menses. This is the first time this has occurred.   No other concerns at this time.

## 2023-07-22 NOTE — Progress Notes (Signed)
   GYNECOLOGY PROGRESS NOTE  History:  38 y.o. G1P1001 presents to [facility] office today for problem gyn visit. She reports her last period was extremely painful.  She has had regular menses without much cramping since starting OCPs in 2024.  Before that periods were   She denies h/a, dizziness, shortness of breath, n/v, or fever/chills.    The following portions of the patient's history were reviewed and updated as appropriate: allergies, current medications, past family history, past medical history, past social history, past surgical history and problem list. Last pap smear on 12/04/21 was normal.  Health Maintenance Due  Topic Date Due   DTaP/Tdap/Td (1 - Tdap) Never done   COVID-19 Vaccine (3 - 2024-25 season) 10/25/2022     Flowsheet Row Office Visit from 07/22/2023 in [facility] at [facility]  PHQ-9 Total Score 6        Review of Systems:  Pertinent items are noted in HPI.   Objective:  Physical Exam Blood pressure 115/85, pulse 75, height 5\' 5"  (1.651 m), weight 177 lb 12.8 oz (80.6 kg), last menstrual period 07/13/2023. VS reviewed, nursing note reviewed,  Constitutional: well developed, well nourished, no distress HEENT: normocephalic CV: normal rate Pulm/chest wall: normal effort Breast Exam: deferred Abdomen: soft Neuro: alert and oriented x 3 Skin: warm, dry Psych: affect normal Pelvic exam: Deferred  Assessment & Plan:  1. Annual gyn exam - Cervicovaginal ancillary only( [place]) --Renewed 2 prescriptions that Dr April Knack originally wrote, for Clarinex  for allergies and Latisse  for insufficient eyelashes.  Pt PCP would not fill these.   - Amb ref to [facility] based on PHQ score  2. Dysmenorrhea --Continue Tylenol and NSAIDs with next menses  - US  PELVIC COMPLETE WITH TRANSVAGINAL; Future - cyclobenzaprine  (FLEXERIL ) 10 MG tablet; Take 1 tablet (10 mg total) by mouth every 8 (eight) hours as needed for  muscle spasms.  Dispense: 30 tablet; Refill: 1 --F/U dependent upon US  results   Return if symptoms worsen or fail to improve.   Arlester Bence, CNM 5:54 PM

## 2023-07-23 LAB — CERVICOVAGINAL ANCILLARY ONLY
Bacterial Vaginitis (gardnerella): POSITIVE — AB
Candida Glabrata: NEGATIVE
Candida Vaginitis: NEGATIVE
Chlamydia: NEGATIVE
Comment: NEGATIVE
Comment: NEGATIVE
Comment: NEGATIVE
Comment: NEGATIVE
Comment: NEGATIVE
Comment: NORMAL
Neisseria Gonorrhea: NEGATIVE
Trichomonas: NEGATIVE

## 2023-07-26 ENCOUNTER — Other Ambulatory Visit: Payer: Self-pay

## 2023-07-26 ENCOUNTER — Ambulatory Visit: Payer: Self-pay | Admitting: Advanced Practice Midwife

## 2023-07-26 DIAGNOSIS — B9689 Other specified bacterial agents as the cause of diseases classified elsewhere: Secondary | ICD-10-CM

## 2023-07-26 DIAGNOSIS — N946 Dysmenorrhea, unspecified: Secondary | ICD-10-CM | POA: Insufficient documentation

## 2023-07-26 MED ORDER — METRONIDAZOLE 500 MG PO TABS: 500.0000 mg | ORAL_TABLET | Freq: Two times a day (BID) | ORAL | 0 refills | Status: AC

## 2023-07-28 ENCOUNTER — Ambulatory Visit (HOSPITAL_COMMUNITY)
Admission: RE | Admit: 2023-07-28 | Discharge: 2023-07-28 | Disposition: A | Discharge status: Home / Self Care | Source: Ambulatory Visit | Attending: Advanced Practice Midwife | Admitting: Advanced Practice Midwife

## 2023-07-28 DIAGNOSIS — D251 Intramural leiomyoma of uterus: Secondary | ICD-10-CM | POA: Diagnosis not present

## 2023-07-28 DIAGNOSIS — N946 Dysmenorrhea, unspecified: Secondary | ICD-10-CM | POA: Diagnosis not present

## 2023-08-03 ENCOUNTER — Other Ambulatory Visit: Payer: Self-pay | Admitting: Advanced Practice Midwife

## 2023-08-03 DIAGNOSIS — N946 Dysmenorrhea, unspecified: Secondary | ICD-10-CM

## 2023-08-03 MED ORDER — NORGESTIMATE-ETH ESTRADIOL 0.25-35 MG-MCG PO TABS: 1.0000 | ORAL_TABLET | Freq: Every day | ORAL | 11 refills | Status: DC

## 2023-10-19 ENCOUNTER — Ambulatory Visit (INDEPENDENT_AMBULATORY_CARE_PROVIDER_SITE_OTHER): Admitting: Family Medicine

## 2023-10-19 VITALS — BP 126/80 | HR 73 | Resp 12 | Ht 65.0 in | Wt 174.5 lb

## 2023-10-19 DIAGNOSIS — E049 Nontoxic goiter, unspecified: Secondary | ICD-10-CM | POA: Diagnosis not present

## 2023-10-19 DIAGNOSIS — Z13228 Encounter for screening for other metabolic disorders: Secondary | ICD-10-CM | POA: Diagnosis not present

## 2023-10-19 DIAGNOSIS — Z1329 Encounter for screening for other suspected endocrine disorder: Secondary | ICD-10-CM | POA: Diagnosis not present

## 2023-10-19 DIAGNOSIS — E785 Hyperlipidemia, unspecified: Secondary | ICD-10-CM | POA: Diagnosis not present

## 2023-10-19 DIAGNOSIS — Z130 Encounter for screening for diseases of the blood and blood-forming organs and certain disorders involving the immune mechanism: Secondary | ICD-10-CM

## 2023-10-19 DIAGNOSIS — Z0000 Encounter for general adult medical examination without abnormal findings: Secondary | ICD-10-CM | POA: Diagnosis not present

## 2023-10-19 LAB — LIPID PANEL
Cholesterol: 259 mg/dL — ABNORMAL HIGH (ref 0–200)
HDL: 65 mg/dL (ref >39.00)
LDL Cholesterol: 174 mg/dL — ABNORMAL HIGH (ref 0–99)
NonHDL: 194.38
Total CHOL/HDL Ratio: 4
Triglycerides: 100 mg/dL (ref 0.0–149.0)
VLDL: 20 mg/dL (ref 0.0–40.0)

## 2023-10-19 LAB — BASIC METABOLIC PANEL WITH GFR
BUN: 10 mg/dL (ref 6–23)
CO2: 27 meq/L (ref 19–32)
Calcium: 8.8 mg/dL (ref 8.4–10.5)
Chloride: 101 meq/L (ref 96–112)
Creatinine, Ser: 0.7 mg/dL (ref 0.40–1.20)
GFR: 111.18 mL/min (ref >60.00)
Glucose, Bld: 89 mg/dL (ref 70–99)
Potassium: 3.7 meq/L (ref 3.5–5.1)
Sodium: 138 meq/L (ref 135–145)

## 2023-10-19 LAB — TSH: TSH: 0.63 u[IU]/mL (ref 0.35–5.50)

## 2023-10-19 NOTE — Assessment & Plan Note (Addendum)
 We discussed the importance of regular physical activity and healthy diet for prevention of chronic illness and/or complications. Preventive guidelines reviewed. Vaccination: Due for Tdap, she prefers to hold on this for now. Continue her preventive female care with gynecologist. Next CPE in a year.

## 2023-10-19 NOTE — Assessment & Plan Note (Signed)
 Continue nonpharmacologic treatment. Further recommendation will be given according to lipid panel result.

## 2023-10-19 NOTE — Patient Instructions (Addendum)
 A few things to remember from today's visit:  Routine general medical examination at a health care facility  Hyperlipidemia, unspecified hyperlipidemia type - Plan: Lipid panel  Screening for endocrine, metabolic and immunity disorder - Plan: Basic metabolic panel with GFR  Enlarged thyroid  gland - Plan: TSH  Do not use My Chart to request refills or for acute issues that need immediate attention. If you send a my chart message, it may take a few days to be addressed, specially if I am not in the office.  Please be sure medication list is accurate. If a new problem present, please set up appointment sooner than planned today.  Health Maintenance, Female Adopting a healthy lifestyle and getting preventive care are important in promoting health and wellness. Ask your health care provider about: The right schedule for you to have regular tests and exams. Things you can do on your own to prevent diseases and keep yourself healthy. What should I know about diet, weight, and exercise? Eat a healthy diet  Eat a diet that includes plenty of vegetables, fruits, low-fat dairy products, and lean protein. Do not eat a lot of foods that are high in solid fats, added sugars, or sodium. Maintain a healthy weight Body mass index (BMI) is used to identify weight problems. It estimates body fat based on height and weight. Your health care provider can help determine your BMI and help you achieve or maintain a healthy weight. Get regular exercise Get regular exercise. This is one of the most important things you can do for your health. Most adults should: Exercise for at least 150 minutes each week. The exercise should increase your heart rate and make you sweat (moderate-intensity exercise). Do strengthening exercises at least twice a week. This is in addition to the moderate-intensity exercise. Spend less time sitting. Even light physical activity can be beneficial. Watch cholesterol and blood  lipids Have your blood tested for lipids and cholesterol at 36 years of age, then have this test every 5 years. Have your cholesterol levels checked more often if: Your lipid or cholesterol levels are high. You are older than 36 years of age. You are at high risk for heart disease. What should I know about cancer screening? Depending on your health history and family history, you may need to have cancer screening at various ages. This may include screening for: Breast cancer. Cervical cancer. Colorectal cancer. Skin cancer. Lung cancer. What should I know about heart disease, diabetes, and high blood pressure? Blood pressure and heart disease High blood pressure causes heart disease and increases the risk of stroke. This is more likely to develop in people who have high blood pressure readings or are overweight. Have your blood pressure checked: Every 3-5 years if you are 67-54 years of age. Every year if you are 14 years old or older. Diabetes Have regular diabetes screenings. This checks your fasting blood sugar level. Have the screening done: Once every three years after age 13 if you are at a normal weight and have a low risk for diabetes. More often and at a younger age if you are overweight or have a high risk for diabetes. What should I know about preventing infection? Hepatitis B If you have a higher risk for hepatitis B, you should be screened for this virus. Talk with your health care provider to find out if you are at risk for hepatitis B infection. Hepatitis C Testing is recommended for: Everyone born from [birth year] through 1963. Anyone with known  risk factors for hepatitis C. Sexually transmitted infections (STIs) Get screened for STIs, including gonorrhea and chlamydia, if: You are sexually active and are younger than 36 years of age. You are older than 36 years of age and your health care provider tells you that you are at risk for this type of infection. Your sexual  activity has changed since you were last screened, and you are at increased risk for chlamydia or gonorrhea. Ask your health care provider if you are at risk. Ask your health care provider about whether you are at high risk for HIV. Your health care provider may recommend a prescription medicine to help prevent HIV infection. If you choose to take medicine to prevent HIV, you should first get tested for HIV. You should then be tested every 3 months for as long as you are taking the medicine. Pregnancy If you are about to stop having your period (premenopausal) and you may become pregnant, seek counseling before you get pregnant. Take 400 to 800 micrograms (mcg) of folic acid every day if you become pregnant. Ask for birth control (contraception) if you want to prevent pregnancy. Osteoporosis and menopause Osteoporosis is a disease in which the bones lose minerals and strength with aging. This can result in bone fractures. If you are 68 years old or older, or if you are at risk for osteoporosis and fractures, ask your health care provider if you should: Be screened for bone loss. Take a calcium or vitamin D supplement to lower your risk of fractures. Be given hormone replacement therapy (HRT) to treat symptoms of menopause. Follow these instructions at home: Alcohol use Do not drink alcohol if: Your health care provider tells you not to drink. You are pregnant, may be pregnant, or are planning to become pregnant. If you drink alcohol: Limit how much you have to: 0-1 drink a day. Know how much alcohol is in your drink. In the [place] one drink equals one 12 oz bottle of beer (355 mL), one 5 oz glass of wine (148 mL), or one 1 oz glass of hard liquor (44 mL). Lifestyle Do not use any products that contain nicotine or tobacco. These products include cigarettes, chewing tobacco, and vaping devices, such as e-cigarettes. If you need help quitting, ask your health care provider. Do not use street  drugs. Do not share needles. Ask your health care provider for help if you need support or information about quitting drugs. General instructions Schedule regular health, dental, and eye exams. Stay current with your vaccines. Tell your health care provider if: You often feel depressed. You have ever been abused or do not feel safe at home. Summary Adopting a healthy lifestyle and getting preventive care are important in promoting health and wellness. Follow your health care provider's instructions about healthy diet, exercising, and getting tested or screened for diseases. Follow your health care provider's instructions on monitoring your cholesterol and blood pressure. This information is not intended to replace advice given to you by your health care provider. Make sure you discuss any questions you have with your health care provider. Document Revised: 07/01/2020 Document Reviewed: 07/01/2020 Elsevier Patient Education  2024 [employer].

## 2023-10-19 NOTE — Progress Notes (Signed)
 HPI: [name] is a 38 y.o. female, who is here today for her routine physical.  Last CPE: 09/25/2022 Follows up with gynecology; last seen about 3 months ago.   Exercise: 3-4 times per week. She does 20-30 minutes of cardio every time she exercises followed by strength training alternating between arms, legs, core depending on the day. She notes she has not been as consistent in the last month due to preoccupations with helping her parents.  Diet: Home-cooked meals - meal preps her lunch and dinner every 2-3 days.  Sleep: 4-6 hours/night. Has tried Melatonin, but not consistently.   Smoking: Former Alcohol consumption: Socially drinks about 2x/month, if that. When drinking she typically prefers wine.  Dental: UTD with routine dental care.  Vision: Wears eye glasses, follows with eye care provider regularly.  Immunization History  Administered Date(s) Administered   Influenza, Seasonal, Injecte, Preservative Fre 12/22/2022   PFIZER(Purple Top)SARS-COV-2 Vaccination 07/21/2019, 08/17/2019   Health Maintenance  Topic Date Due   DTaP/Tdap/Td (1 - Tdap) Never done   Hepatitis B Vaccines 19-59 Average Risk (1 of 3 - 19+ 3-dose series) Never done   HPV VACCINES (1 - 3-dose SCDM series) Never done   COVID-19 Vaccine (3 - 2024-25 season) 10/25/2022   INFLUENZA VACCINE  09/24/2023   Cervical Cancer Screening (HPV/Pap Cotest)  12/05/2026   Hepatitis C Screening  Completed   HIV Screening  Completed   Pneumococcal Vaccine  Aged Out   Meningococcal B Vaccine  Aged Out   Chronic medical problems:   Hyperlipidemia Not currently on pharmacologic treatment.   Lab Results  Component Value Date   CHOL 261 (H) 09/25/2022   HDL 68.20 09/25/2022   LDLCALC 167 (H) 09/25/2022   TRIG 128.0 09/25/2022   CHOLHDL 4 09/25/2022   No cute concerns addressed today.  Review of Systems  Constitutional:  Negative for activity change, appetite change and fever.  HENT:  Negative for mouth  sores, sore throat and trouble swallowing.   Eyes:  Negative for redness and visual disturbance.  Respiratory:  Negative for cough, shortness of breath and wheezing.   Cardiovascular:  Negative for chest pain and leg swelling.  Gastrointestinal:  Negative for abdominal pain, nausea and vomiting.  Endocrine: Negative for cold intolerance, heat intolerance, polydipsia, polyphagia and polyuria.  Genitourinary:  Negative for decreased urine volume, dysuria and hematuria.  Musculoskeletal:  Negative for gait problem and myalgias.  Skin:  Negative for color change and rash.  Allergic/Immunologic: Positive for environmental allergies.  Neurological:  Negative for syncope, weakness and headaches.  Hematological:  Negative for adenopathy. Does not bruise/bleed easily.  Psychiatric/Behavioral:  Negative for confusion and hallucinations.   All other systems reviewed and are negative.  Current Outpatient Medications on File Prior to Visit  Medication Sig Dispense Refill   bimatoprost  (LATISSE ) 0.03 % ophthalmic solution Place 1 drop into both eyes at bedtime. 3 mL 12   cyclobenzaprine  (FLEXERIL ) 10 MG tablet Take 1 tablet (10 mg total) by mouth every 8 (eight) hours as needed for muscle spasms. 30 tablet 1   desloratadine  (CLARINEX ) 5 MG tablet Take 1 tablet (5 mg total) by mouth daily. 30 tablet 11   norgestimate -ethinyl estradiol  (ORTHO-CYCLEN) 0.25-35 MG-MCG tablet Take 1 tablet by mouth daily. 28 tablet 11   prenatal vitamin w/FE, FA (PRENATAL 1 + 1) 27-1 MG TABS tablet Take 1 tablet by mouth daily before breakfast. 90 tablet 4   valACYclovir  (VALTREX ) 1000 MG tablet Take 1 tablet twice a  day for 10 days, then take 1 tablet daily for suppression. 30 tablet 12   No current facility-administered medications on file prior to visit.   Past Medical History:  Diagnosis Date   Allergy    History reviewed. No pertinent surgical history.  No Known Allergies  Family History  Problem Relation Age of  Onset   Hypertension Mother    Social History   Socioeconomic History   Marital status: Single    Spouse name: Not on file   Number of children: Not on file   Years of education: Not on file   Highest education level: Some college, no degree  Occupational History   Not on file  Tobacco Use   Smoking status: Former    Current packs/day: 0.00    Types: Cigarettes    Quit date: 09/08/2007    Years since quitting: 16.1   Smokeless tobacco: Never  Vaping Use   Vaping status: Never Used  Substance and Sexual Activity   Alcohol use: Yes    Comment: occasionally    Drug use: No   Sexual activity: Yes    Partners: Male    Birth control/protection: Pill  Other Topics Concern   Not on file  Social History Narrative   Not on file   Social Drivers of Health   Financial Resource Strain: Medium Risk (10/19/2023)   Overall Financial Resource Strain (CARDIA)    Difficulty of Paying Living Expenses: Somewhat hard  Food Insecurity: No Food Insecurity (10/19/2023)   Hunger Vital Sign    Worried About Running Out of Food in the Last Year: Never true    Ran Out of Food in the Last Year: Never true  Transportation Needs: No Transportation Needs (10/19/2023)   PRAPARE - Administrator, Civil Service (Medical): No    Lack of Transportation (Non-Medical): No  Physical Activity: Sufficiently Active (10/19/2023)   Exercise Vital Sign    Days of Exercise per Week: 4 days    Minutes of Exercise per Session: 60 min  Stress: Stress Concern Present (10/19/2023)   [employer] of Occupational Health - Occupational Stress Questionnaire    Feeling of Stress: To some extent  Social Connections: Unknown (10/19/2023)   Social Connection and Isolation Panel    Frequency of Communication with Friends and Family: Three times a week    Frequency of Social Gatherings with Friends and Family: Once a week    Attends Religious Services: 1 to 4 times per year    Active Member of [employer] or  Organizations: No    Attends Banker Meetings: Not on file    Marital Status: Not on file   Today's Vitals   10/19/23 0920  BP: 126/80  Pulse: 73  Resp: 12  SpO2: 99%  Weight: 174 lb 8 oz (79.2 kg)  Height: 5' 5 (1.651 m)   Body mass index is 29.04 kg/m.  Wt Readings from Last 3 Encounters:  10/19/23 174 lb 8 oz (79.2 kg)  07/22/23 177 lb 12.8 oz (80.6 kg)  12/15/22 164 lb 6.4 oz (74.6 kg)   Physical Exam Vitals and nursing note reviewed.  Constitutional:      General: She is not in acute distress.    Appearance: She is well-developed.  HENT:     Head: Normocephalic and atraumatic.     Right Ear: Hearing, tympanic membrane, ear canal and external ear normal.     Left Ear: Hearing, tympanic membrane, ear canal and external ear normal.  Mouth/Throat:     Mouth: Mucous membranes are moist.     Pharynx: Oropharynx is clear. Uvula midline.  Eyes:     Extraocular Movements: Extraocular movements intact.     Conjunctiva/sclera: Conjunctivae normal.     Pupils: Pupils are equal, round, and reactive to light.  Neck:     Thyroid : No thyroid  mass or thyromegaly (palpable.).  Cardiovascular:     Rate and Rhythm: Normal rate and regular rhythm.     Pulses:          Dorsalis pedis pulses are 2+ on the right side and 2+ on the left side.     Heart sounds: No murmur heard. Pulmonary:     Effort: Pulmonary effort is normal. No respiratory distress.     Breath sounds: Normal breath sounds.  Abdominal:     Palpations: Abdomen is soft. There is no hepatomegaly or mass.     Tenderness: There is no abdominal tenderness.  Genitourinary:    Comments: Deferred to gyn. Musculoskeletal:     Comments: No major deformity or signs of synovitis appreciated.  Lymphadenopathy:     Cervical: No cervical adenopathy.     Upper Body:     Right upper body: No supraclavicular adenopathy.     Left upper body: No supraclavicular adenopathy.  Skin:    General: Skin is warm.      Findings: No erythema or rash.  Neurological:     General: No focal deficit present.     Mental Status: She is alert and oriented to person, place, and time.     Cranial Nerves: No cranial nerve deficit.     Coordination: Coordination normal.     Gait: Gait normal.     Deep Tendon Reflexes:     Reflex Scores:      Bicep reflexes are 2+ on the right side and 2+ on the left side.      Patellar reflexes are 2+ on the right side and 2+ on the left side. Psychiatric:        Mood and Affect: Mood and affect normal.    ASSESSMENT AND PLAN: Ms. Adrienne [name] [name] was here today annual physical examination.  Orders Placed This Encounter  Procedures   Basic metabolic panel with GFR   Lipid panel   TSH   Lab Results  Component Value Date   TSH 0.63 10/19/2023   Lab Results  Component Value Date   NA 138 10/19/2023   CL 101 10/19/2023   K 3.7 10/19/2023   CO2 27 10/19/2023   BUN 10 10/19/2023   CREATININE 0.70 10/19/2023   GFR 111.18 10/19/2023   CALCIUM 8.8 10/19/2023   GLUCOSE 89 10/19/2023   Lab Results  Component Value Date   CHOL 259 (H) 10/19/2023   HDL 65.00 10/19/2023   LDLCALC 174 (H) 10/19/2023   TRIG 100.0 10/19/2023   CHOLHDL 4 10/19/2023   Routine general medical examination at a health care facility Assessment & Plan: We discussed the importance of regular physical activity and healthy diet for prevention of chronic illness and/or complications. Preventive guidelines reviewed. Vaccination: Due for Tdap, she prefers to hold on this for now. Continue her preventive female care with gynecologist. Next CPE in a year.  Hyperlipidemia, unspecified hyperlipidemia type Assessment & Plan: Continue nonpharmacologic treatment. Further recommendation will be given according to lipid panel result.  Orders: -     Lipid panel; Future  Screening for endocrine, metabolic and immunity disorder -  Basic metabolic panel with GFR; Future  Enlarged thyroid   gland Mild. She prefers to hold on imaging for now.  -     TSH; Future  Return in 1 year (on 10/18/2024) for CPE, chronic problems.  I, Vernell Forest, acting as a scribe for Labella Zahradnik [place], MD., have documented all relevant documentation on the behalf of Burlon Centrella [place], MD, as directed by   while in the presence of Jhoanna Heyde [place], MD.  I, Erubiel Manasco [place], MD, have reviewed all documentation for this visit. The documentation on 10/19/23 for the exam, diagnosis, procedures, and orders are all accurate and complete.  Sofya Moustafa G. [place], MD  [facility]

## 2023-10-20 ENCOUNTER — Ambulatory Visit: Payer: Self-pay | Admitting: Family Medicine

## 2023-11-16 ENCOUNTER — Ambulatory Visit: Admitting: Advanced Practice Midwife

## 2023-12-21 ENCOUNTER — Encounter: Payer: Self-pay | Admitting: Advanced Practice Midwife

## 2023-12-21 ENCOUNTER — Other Ambulatory Visit (HOSPITAL_BASED_OUTPATIENT_CLINIC_OR_DEPARTMENT_OTHER): Payer: Self-pay

## 2023-12-21 ENCOUNTER — Ambulatory Visit: Admitting: Advanced Practice Midwife

## 2023-12-21 VITALS — BP 121/80 | HR 65 | Ht 65.0 in | Wt 178.6 lb

## 2023-12-21 DIAGNOSIS — Z3169 Encounter for other general counseling and advice on procreation: Secondary | ICD-10-CM | POA: Diagnosis not present

## 2023-12-21 DIAGNOSIS — D251 Intramural leiomyoma of uterus: Secondary | ICD-10-CM

## 2023-12-21 DIAGNOSIS — N939 Abnormal uterine and vaginal bleeding, unspecified: Secondary | ICD-10-CM | POA: Diagnosis not present

## 2023-12-21 MED ORDER — FLUZONE 0.5 ML IM SUSY
0.5000 mL | PREFILLED_SYRINGE | Freq: Once | INTRAMUSCULAR | 0 refills | Status: AC
  Filled 2023-12-21: qty 0.5, 1d supply, fill #0

## 2023-12-21 MED ORDER — ETONOGESTREL-ETHINYL ESTRADIOL 0.12-0.015 MG/24HR VA RING
VAGINAL_RING | VAGINAL | 4 refills | Status: AC
  Filled 2024-01-19 – 2024-01-22 (×3): qty 3, 84d supply, fill #0

## 2023-12-21 NOTE — Progress Notes (Signed)
   GYNECOLOGY PROGRESS NOTE  History:  38 y.o. G1P1001 presents to [facility] office today for problem gyn visit. She reports she is taking Sprintec daily without missing pills but still having irregular bleeding, at least 2 times per month.  She is considering trying to become pregnant in the next 2-3 months but worried that she shouldn't because of all the irregular periods.  US  on 07/28/23 showed 2 small fibroids.    She denies h/a, dizziness, shortness of breath, n/v, or fever/chills.    The following portions of the patient's history were reviewed and updated as appropriate: allergies, current medications, past family history, past medical history, past social history, past surgical history and problem list. Last pap smear on 02/03/22 was normal.   Health Maintenance Due  Topic Date Due   DTaP/Tdap/Td (1 - Tdap) Never done   Hepatitis B Vaccines 19-59 Average Risk (1 of 3 - 19+ 3-dose series) Never done   HPV VACCINES (1 - 3-dose SCDM series) Never done   COVID-19 Vaccine (3 - 2025-26 season) 10/25/2023     Review of Systems:  Pertinent items are noted in HPI.   Objective:  Physical Exam Blood pressure 121/80, pulse 65, height 5' 5 (1.651 m), weight 178 lb 9.6 oz (81 kg), last menstrual period 12/19/2023. VS reviewed, nursing note reviewed,  Constitutional: well developed, well nourished, no distress HEENT: normocephalic CV: normal rate Pulm/chest wall: normal effort Breast Exam: deferred Abdomen: soft Neuro: alert and oriented x 3 Skin: warm, dry Psych: affect normal Pelvic exam: Deferred  Assessment & Plan:  1. Abnormal uterine bleeding (AUB) (Primary) --Pt taking Sprintec, with 35 mcg of estrogen but still having 2 periods/month that are irregular and unpredictable.   --Small fibroids seen on US , see below. --Discussed options, including surgical consult with MD, IUD placement, or change in OCPs.  Pt is currently taking 35 mcg estrogen OCP.  --Some evidence for  switching to tri-cyclic OCP OR vaginal ring for steady serum dosing in Up-to-Date.  --Pt planning a pregnancy in near future so reassurance provided that pregnancy is likely to be normal, but irregular periods can affect fertility.  --Pt opts to switch to Nuvaring for now, will stop after a month or 2 and try for pregnancy.  Encouraged to use ovulation kit given irregular cycles. --F/U in 4 months in office  - etonogestrel-ethinyl estradiol  (NUVARING) 0.12-0.015 MG/24HR vaginal ring; Insert vaginally and leave in place for 3 consecutive weeks, then remove for 1 week.  Dispense: 3 each; Refill: 4  2. Pre-conception counseling --Start PNV daily  3. Intramural leiomyoma of uterus --US  on 07/28/23 with 2 small fibroids (approx. 2 x 2 x 2 cm in diameter) noted. Likely contributing to pt bleeding pattern. Discussed option to follow up with MD for surgical consult. Pt to consider if problems with fertility.      Return in about 4 months (around 04/22/2024) for AUB follow up with me.   Olam Boards, CNM 3:04 PM

## 2023-12-21 NOTE — Progress Notes (Signed)
 Pt on BC pills; pt reports her period previously would last for months at a time. She is still experiencing more than one monthly period with spotting in between and clots. Bleeding currently (started two days ago).   Concerns about fertility. Asking about starting prenatal vitamins.  No other concerns at this time.

## 2024-01-07 ENCOUNTER — Encounter: Payer: Self-pay | Admitting: Advanced Practice Midwife

## 2024-01-07 ENCOUNTER — Other Ambulatory Visit: Payer: Self-pay

## 2024-01-07 MED ORDER — FLUCONAZOLE 150 MG PO TABS: 150.0000 mg | ORAL_TABLET | Freq: Once | ORAL | 0 refills | Status: AC

## 2024-01-10 ENCOUNTER — Other Ambulatory Visit: Payer: Self-pay

## 2024-01-10 MED ORDER — FLUCONAZOLE 150 MG PO TABS: 150.0000 mg | ORAL_TABLET | Freq: Once | ORAL | 0 refills | Status: AC

## 2024-01-19 ENCOUNTER — Other Ambulatory Visit (HOSPITAL_COMMUNITY): Payer: Self-pay

## 2024-01-19 MED ORDER — NORGESTIMATE-ETH ESTRADIOL 0.25-35 MG-MCG PO TABS
1.0000 | ORAL_TABLET | Freq: Every day | ORAL | 11 refills | Status: AC
  Filled 2024-01-19: qty 28, 28d supply, fill #0

## 2024-01-22 ENCOUNTER — Other Ambulatory Visit (HOSPITAL_BASED_OUTPATIENT_CLINIC_OR_DEPARTMENT_OTHER): Payer: Self-pay

## 2024-01-24 ENCOUNTER — Other Ambulatory Visit (HOSPITAL_COMMUNITY): Payer: Self-pay
